# Patient Record
Sex: Female | Born: 1935 | Race: White | Hispanic: No | Marital: Married | Smoking: Former smoker
Health system: Southern US, Community
[De-identification: ages and names within clinical notes are randomized; demographics above are authoritative.]

## PROBLEM LIST (undated history)

## (undated) DIAGNOSIS — G4733 Obstructive sleep apnea (adult) (pediatric): Secondary | ICD-10-CM

## (undated) DIAGNOSIS — Q628 Other congenital malformations of ureter: Secondary | ICD-10-CM

## (undated) DIAGNOSIS — T8859XA Other complications of anesthesia, initial encounter: Secondary | ICD-10-CM

## (undated) DIAGNOSIS — K219 Gastro-esophageal reflux disease without esophagitis: Secondary | ICD-10-CM

## (undated) DIAGNOSIS — N1330 Unspecified hydronephrosis: Secondary | ICD-10-CM

## (undated) DIAGNOSIS — F32A Depression, unspecified: Secondary | ICD-10-CM

## (undated) DIAGNOSIS — M1990 Unspecified osteoarthritis, unspecified site: Secondary | ICD-10-CM

## (undated) DIAGNOSIS — N289 Disorder of kidney and ureter, unspecified: Secondary | ICD-10-CM

## (undated) DIAGNOSIS — R0602 Shortness of breath: Secondary | ICD-10-CM

## (undated) DIAGNOSIS — G2581 Restless legs syndrome: Secondary | ICD-10-CM

## (undated) DIAGNOSIS — I10 Essential (primary) hypertension: Secondary | ICD-10-CM

## (undated) DIAGNOSIS — F329 Major depressive disorder, single episode, unspecified: Secondary | ICD-10-CM

## (undated) DIAGNOSIS — F419 Anxiety disorder, unspecified: Secondary | ICD-10-CM

## (undated) DIAGNOSIS — T4145XA Adverse effect of unspecified anesthetic, initial encounter: Secondary | ICD-10-CM

## (undated) DIAGNOSIS — R351 Nocturia: Secondary | ICD-10-CM

## (undated) DIAGNOSIS — M75101 Unspecified rotator cuff tear or rupture of right shoulder, not specified as traumatic: Secondary | ICD-10-CM

## (undated) DIAGNOSIS — Z9989 Dependence on other enabling machines and devices: Secondary | ICD-10-CM

## (undated) DIAGNOSIS — E039 Hypothyroidism, unspecified: Secondary | ICD-10-CM

## (undated) HISTORY — PX: CARDIAC CATHETERIZATION: SHX172

## (undated) HISTORY — PX: OTHER SURGICAL HISTORY: SHX169

## (undated) HISTORY — PX: TONSILLECTOMY AND ADENOIDECTOMY: SUR1326

---

## 1962-09-15 HISTORY — PX: TUBAL LIGATION: SHX77

## 1976-09-15 HISTORY — PX: VAGINAL HYSTERECTOMY: SUR661

## 2000-09-15 HISTORY — PX: ABDOMINOPLASTY: SUR9

## 2001-11-19 ENCOUNTER — Ambulatory Visit: Admission: RE | Admit: 2001-11-19 | Discharge: 2002-02-17 | Payer: Self-pay | Admitting: Radiation Oncology

## 2001-12-14 ENCOUNTER — Ambulatory Visit (HOSPITAL_COMMUNITY): Admission: RE | Admit: 2001-12-14 | Discharge: 2001-12-14 | Payer: Self-pay | Admitting: *Deleted

## 2001-12-14 ENCOUNTER — Encounter (INDEPENDENT_AMBULATORY_CARE_PROVIDER_SITE_OTHER): Payer: Self-pay

## 2002-01-28 ENCOUNTER — Encounter: Admission: RE | Admit: 2002-01-28 | Discharge: 2002-01-28 | Payer: Self-pay | Admitting: Internal Medicine

## 2002-01-28 ENCOUNTER — Encounter: Payer: Self-pay | Admitting: Internal Medicine

## 2002-04-19 ENCOUNTER — Encounter: Admission: RE | Admit: 2002-04-19 | Discharge: 2002-04-19 | Payer: Self-pay | Admitting: Internal Medicine

## 2002-04-19 ENCOUNTER — Encounter: Payer: Self-pay | Admitting: Internal Medicine

## 2002-05-04 ENCOUNTER — Encounter: Payer: Self-pay | Admitting: Internal Medicine

## 2002-05-04 ENCOUNTER — Encounter: Admission: RE | Admit: 2002-05-04 | Discharge: 2002-05-04 | Payer: Self-pay | Admitting: Internal Medicine

## 2004-03-01 ENCOUNTER — Ambulatory Visit (HOSPITAL_COMMUNITY): Admission: RE | Admit: 2004-03-01 | Discharge: 2004-03-01 | Payer: Self-pay | Admitting: Gastroenterology

## 2005-09-24 HISTORY — PX: CARDIOVASCULAR STRESS TEST: SHX262

## 2006-08-19 ENCOUNTER — Encounter: Admission: RE | Admit: 2006-08-19 | Discharge: 2006-08-19 | Payer: Self-pay | Admitting: Internal Medicine

## 2007-12-29 ENCOUNTER — Inpatient Hospital Stay (HOSPITAL_COMMUNITY): Admission: EM | Admit: 2007-12-29 | Discharge: 2007-12-31 | Payer: Self-pay | Admitting: Family Medicine

## 2007-12-29 ENCOUNTER — Ambulatory Visit: Payer: Self-pay | Admitting: *Deleted

## 2007-12-29 ENCOUNTER — Ambulatory Visit: Payer: Self-pay | Admitting: Pulmonary Disease

## 2007-12-31 ENCOUNTER — Encounter (INDEPENDENT_AMBULATORY_CARE_PROVIDER_SITE_OTHER): Payer: Self-pay | Admitting: Interventional Cardiology

## 2007-12-31 HISTORY — PX: TRANSTHORACIC ECHOCARDIOGRAM: SHX275

## 2008-01-05 HISTORY — PX: OTHER SURGICAL HISTORY: SHX169

## 2008-01-13 ENCOUNTER — Encounter: Admission: RE | Admit: 2008-01-13 | Discharge: 2008-01-13 | Payer: Self-pay | Admitting: Internal Medicine

## 2008-02-04 ENCOUNTER — Ambulatory Visit (HOSPITAL_BASED_OUTPATIENT_CLINIC_OR_DEPARTMENT_OTHER): Admission: RE | Admit: 2008-02-04 | Discharge: 2008-02-05 | Payer: Self-pay | Admitting: Orthopedic Surgery

## 2008-08-15 ENCOUNTER — Ambulatory Visit (HOSPITAL_BASED_OUTPATIENT_CLINIC_OR_DEPARTMENT_OTHER): Admission: RE | Admit: 2008-08-15 | Discharge: 2008-08-15 | Payer: Self-pay | Admitting: Orthopedic Surgery

## 2008-08-15 ENCOUNTER — Encounter (INDEPENDENT_AMBULATORY_CARE_PROVIDER_SITE_OTHER): Payer: Self-pay | Admitting: Orthopedic Surgery

## 2008-08-15 HISTORY — PX: OTHER SURGICAL HISTORY: SHX169

## 2008-08-18 ENCOUNTER — Encounter: Admission: RE | Admit: 2008-08-18 | Discharge: 2008-08-18 | Payer: Self-pay | Admitting: Internal Medicine

## 2009-08-07 ENCOUNTER — Encounter: Admission: RE | Admit: 2009-08-07 | Discharge: 2009-08-07 | Payer: Self-pay | Admitting: Internal Medicine

## 2009-08-16 ENCOUNTER — Encounter: Admission: RE | Admit: 2009-08-16 | Discharge: 2009-08-16 | Payer: Self-pay | Admitting: Internal Medicine

## 2009-08-29 ENCOUNTER — Ambulatory Visit (HOSPITAL_COMMUNITY): Admission: RE | Admit: 2009-08-29 | Discharge: 2009-08-29 | Payer: Self-pay | Admitting: Urology

## 2011-01-28 NOTE — Assessment & Plan Note (Signed)
[facility]   NAME:  [name], [name]                 ACCOUNT NO.:  [id number]   MEDICAL RECORD NO.:  [id number]      DATE OF BIRTH:  03-31-1936   PHYSICIAN:  Lyn Records, M.D.         VISIT DATE:                                   OFFICE VISIT   CHIEF COMPLAINT:  Chest pain.   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  She states that around 7  p.m. she had the sudden onset of 8/10 left-sided chest pressure,  radiated down her left arm, that was associated with shortness of  breath.  She denies any palpations, focal weakness, nausea, vomiting, or  diaphoresis.  She stepped outside to get some fresh air with some mild  relief of her symptoms; however, they finally, spontaneously resolved  after about an hour.  She went to bed last night without any discomfort,  however, woke up this morning and went about her usual business and  later on in the afternoon developed recurrence of this chest pain, but  with no radiation, no associated symptoms.  She describes the pressure  as 4/10, and continued even she came to the emergency room for  evaluation.  In the emergency department, she was given some  nitroglycerin and placed on a nitroglycerin drip, with some mild relief  of her symptoms; however, she still complains of some mild chest  pressure currently.  She currently denies any other symptoms, and her  first set of cardiac markers showed a slightly positive D-dimer, as well  as slightly elevated troponin without any acute changes.   REVIEW OF SYSTEMS:  The patient complains of some mild lower extremity  edema, that is chronic.  She occasionally has palpitations related to  panic and anxiety attacks.  She denies any orthopnea or PND.  Remaining  review of systems as in the HPI, otherwise negative.   ALLERGIES:  1.  PENICILLIN.  2. SULFA.   PAST MEDICAL HISTORY:  1. Anxiety/panic attacks.  2. Hypertension.  3. Hypothyroidism.  4. History of a cardiac catheterization.  She was negative about 10-15      years ago, done by Dr. Katrinka Blazing.  5. History of a negative stress test, about 3 years ago.   CURRENT MEDICATIONS:  1. Alprazolam 0.5 mg p.r.n.  2. Aspirin 81 mg daily.  3. Benicar 40 mg daily.  4. Lexapro 10 mg daily.  5. Niaspan 500 mg daily.  6. Synthroid 112 mcg daily.   SOCIAL HISTORY:  She lives in [place] with her husband.  She quit  smoking 10 years ago.  She denies any other habits.   FAMILY HISTORY:  Her mother died of complications of a stroke and had  dementia.  Her father died of complications with lung cancer.  None of  her siblings have coronary disease, although she has a brother who has  had a carotid endarterectomy.   PHYSICAL EXAMINATION:  VITAL SIGNS:  Blood pressure is 131/54, pulse of  82, temperature of 97.5, respiratory rate of 20, and O2 saturation is  92% on  room air.  GENERAL:  She is alert and oriented x3 in no acute distress.  HEENT:  Normocephalic and atraumatic.  Pupils equal, round, and reactive  to light.  Sclerae anicteric.  NECK:  Supple with no lymphadenopathy.  No carotid bruits.  2+ carotid  pulses bilaterally.  LUNGS:  Clear to auscultation bilaterally without wheezes, rhonchi, or  rales.  CARDIOVASCULAR:  Normal rate and rhythm.  Normal S1 and S2 without any  murmurs, rubs, or gallops, as well as a nondisplaced PMI.  ABDOMEN:  Trunk obesity.  Positive bowel sounds.  Soft, nontender, and  nondistended.  EXTREMITIES:  2+ pulses with some trace bilateral pedal edema, but no  clubbing, no cyanosis  NEUROLOGIC:  Grossly nonfocal.   LABORATORY DATA:  Chest x-ray shows no effusion or edema.  No acute  process.  An EKG shows a normal sinus rhythm with rate of 62 with a  normal access, normal intervals without any Q waves or ST-T wave  changes,  suggestive of ischemia.  There is no prior for comparison.  CBC  is within normal limits.  The chem-7 is within normal limits.  Troponin  of 0.08.  BNP is less than 30.  CK-MB is 1.5.  A D-dimer is slightly  elevated at 52.   ASSESSMENT:  1. Atypical chest pain, but with an elevated troponin I, but with no      EKG changes.  Possibilities include a non-ST elevation myocardial      infarction versus an atypical presentation for pulmonary embolism.  2. Hypertension.  3. Anxiety disorder.  4. Hypothyroidism.   PLAN:  The patient will be admitted to telemetry bed, rule out by  cardiac enzymes; however, given that her first set of markers are  slightly positive, we will go ahead and place her on a full-dose Lovenox  q.12 h.  We will continue her on aspirin, ACE inhibitors, and start her  on the low-dose beta-blocker.  We will check lipids as well as a TSH.  Dr. Katrinka Blazing to see the patient in the morning and evaluate the patient  further for possible cardiac catheterization.  At this time, I recommend  a CT angiogram of the chest if cardiac catheterization in unrevealing.      Bimal R. Sherryll Burger, MD   Electronically Signed     ______________________________  Lyn Records, M.D.    BRS/MEDQ  D:  12/29/2007  T:  12/30/2007  Job:  [id number]

## 2011-01-28 NOTE — Op Note (Signed)
NAMEANNALIZA, [name]                 ACCOUNT NO.:  [id number]   MEDICAL RECORD NO.:  [id number]          PATIENT TYPE:  AMB   LOCATION:  DSC                          FACILITY:  [facility]   PHYSICIAN:  Cindee Salt, M.D.       DATE OF BIRTH:  1936/01/28   DATE OF PROCEDURE:  08/15/2008  DATE OF DISCHARGE:                               OPERATIVE REPORT   PREOPERATIVE DIAGNOSIS:  Status post open reduction and internal  fixation, left distal radius, distal volar radiation hand innovation  plate with mass extensor tendon, index finger, right hand.   POSTOPERATIVE DIAGNOSIS:  Status post open reduction and internal  fixation, left distal radius, distal volar radiation hand innovation  plate with mass extensor tendon, index finger, right hand.   OPERATIONS:  1. Removal of distal volar radiation plate.  2. Debridement of mass, index finger, extensor tendon, right hand.   SURGEON:  Cindee Salt, MD   ANESTHESIA:  General.   ANESTHESIOLOGIST:  Burna Forts, MD   HISTORY:  The patient is a 75 year old female who suffered a fall,  distal radius fracture, underwent open reduction and internal fixation  with a volar DVR hand innovations plate.  Approximately 3 months  following the surgery, she showed inability to extend her index finger  with weakness and a mass formed on the dorsal aspect of her hand over  the metacarpals with a possibility of a ruptured extensor tendon.  She  was advised removal of the plate with tendon transfer repair.  She  subsequently has had the ability to fully extend and flex her index  finger without difficulty, but the mass is palpable.  She is admitted  now for removal of the plate, exploration and extensor tendons.  She is  aware of risks and complications including infection, recurrence of  injury to arteries, nerves, tendons, incomplete relief of symptoms,  dystrophy, weakness of the bone, and possibility of refracture.  She is  desirous of proceeding to have  this done.  In the preoperative area, the  patient was seen and the extremity marked by both the patient and  surgeon.  Antibiotic given.   PROCEDURE:  The patient was brought to the operating room where general  anesthetic was carried out without difficulty under the direction of Dr.  Jacklynn Bue.  She was prepped using DuraPrep, supine position, left arm  free.  The limb was exsanguinated after a time-out was taken with an  Esmarch bandage and tourniquet inflated to 250 mmHg.  The dorsal  incision was made, carried down through subcutaneous tissue and mass,  which appeared to be a partial rupture of the extensor tendon.  The  index finger was identified.  The incision was extended proximally, the  retinaculum opened over the distal radius.  No penetration of screws  were noted.  The finger was able to be extended with both the extensor  indicis proprius and the extensor digitorum communis of the index  finger.  Mass at the index finger was then debrided and sent to  pathology.  The wound was irrigated.  There was  no impingement on the  extensor retinaculum with flexion/extension of the finger.  The skin  closed with interrupted 5-0 Vicryl Rapide sutures.  The old incision was  made on the volar aspect, carried down through subcutaneous tissue.  The  radial artery protected.  Dissection carried between it and the flexor  carpi radialis.  The pronator quadratus was noted to have fully healed.  This was incised on its radial aspect, the plate was reidentified and  removed without difficulty.  Full healing of the fracture was noted.  The wound was irrigated.  Pronator quadratus was repaired with figure-of-  eight and 4-0 Vicryl sutures.  The subcutaneous tissue with 4-0 Vicryl  and skin with interrupted 4-0 Vicryl Rapide.  A sterile compressive  dressing and splint with wrist dorsiflexed was applied.  The patient  tolerated the procedure well.  On deflation of the tourniquet, all  fingers  immediately pinked.  She was taken to the recovery room for  observation in satisfactory condition.  She will be discharged home.  To  return to the [facility] in 1 week, on Vicodin.           ______________________________  Cindee Salt, M.D.     GK/MEDQ  D:  08/15/2008  T:  08/16/2008  Job:  [id number]

## 2011-01-28 NOTE — Cardiovascular Report (Signed)
[name], [name] NO.:  [id number]   MEDICAL RECORD NO.:  [id number]          PATIENT TYPE:  INP   LOCATION:  3736                         FACILITY:  [facility]   PHYSICIAN:  Lyn Records, M.D.   DATE OF BIRTH:  1936/03/04   DATE OF PROCEDURE:  12/30/2007  DATE OF DISCHARGE:                            CARDIAC CATHETERIZATION   INDICATIONS:  Recurring chest discomfort over the last 48 hours with an  associated minimally elevated troponin I on presentation.  Normal EKG.  Risk factors for coronary artery disease.   PROCEDURE PERFORMED:  1. Left heart catheterization.  2. Selective coronary angiography.  3. Left ventriculography.   DESCRIPTION:  After informed consent, 6-French sheath was placed in the  right femoral artery using the SMART needle because of faintly palpable  pulses.  A single anterior stick was performed without complications.  Guidewire was advanced into the aorta and the sheath was placed using  the modified Seldinger technique.   A 6-French A2 multipurpose catheter was then inserted over the guidewire  and used for hemodynamic recordings, left ventriculography by hand  injection, and selective right coronary angiography.  We used a 6-French  #4 left Judkins catheter for left coronary angiography.  The patient  does not demonstrate any significant coronary disease.  We performed a  sheath injection but were not in proper position to perform closure with  Angio-Seal.  Manual compression was performed with good hemostasis.   RESULTS:  1. Hemodynamic data:      a.     Aortic pressure of 112/64.      b.     Left ventricular pressure of 115/50.  2. Left ventriculography:  Left ventricular cavity is normal in size,      and there is vigorous contractility with EF of 65%.  No mitral      regurgitation.  3. Coronary angiography:      a.     Left main coronary vessel is normal.  B:  Left anterior descending coronary artery:  Large vessel that  wraps  around the left ventricular apex.  The vessel is entirely normal.  C.  Ramus branch:  The ramus is a diffuse branch that bifurcates on the  left lateral wall and it is entirely normal.  D.  Right coronary:  The right coronary is a dominant vessel, large and  widely patent.   CONCLUSION:  1. Normal coronary arteries.  2. Normal LV function.  3. Chest discomfort.  4. Etiology uncertain, not felt secondary to ischemic heart disease or      significant coronary obstruction.   PLAN:  1. Bedrest x4 hours.  2. Ambulate.  3. Possible discharge later today.      Lyn Records, M.D.  Electronically Signed     HWS/MEDQ  D:  12/30/2007  T:  12/31/2007  Job:  [id number]   cc:   [facility]

## 2011-01-28 NOTE — Consult Note (Signed)
[name], [name]                 ACCOUNT NO.:  [id number]   MEDICAL RECORD NO.:  [id number]          PATIENT TYPE:  INP   LOCATION:  3736                         FACILITY:  [facility]   PHYSICIAN:  Oretha Milch, MD      DATE OF BIRTH:  1936-02-28   DATE OF CONSULTATION:  12/30/2007  DATE OF DISCHARGE:                                 CONSULTATION   PRIMARY CARE PHYSICIAN:  Dr. Jethro Bolus and Dr. Fayrene Fearing.   REASON FOR CONSULTATION:  Unexplained dyspnea.   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  She felt a little bit nauseous,  then went home and laid down and this seemed to resolve.  However, while  playing bridge the next day with a retired Engineer, civil (consulting), she was asked to seek  evaluation for this.  She had an initial positive troponin and underwent  a diagnostic cardiac catheterization, which showed normal coronaries and  normal LV function.   She describes dyspnea for the past few months.  This seems to happen  when she walks with her dog.  She occasionally gets on a treadmill, [place] and [place] then.  She describes a 20-30  pounds weight loss over the past few years.  Her husband was not present  here to collaborate the history, apparently reported that she breathes  heavily in her sleep and has noted a few pauses in her sleep.  He also  has reported snoring.   PAST MEDICAL HISTORY:  Includes hypertension, hypothyroidism, status  post radioiodine therapy, anxiety, and panic attacks.   PAST SURGICAL HISTORY:  Hysterectomy.   ALLERGIES:  PENICILLIN and SULFA.   CURRENT MEDICATIONS PRIOR TO ADMISSION:  Include:  1. Alprazolam 0.5 mg p.r.n.  2. Ambien 10 mg nightly p.r.n. (once or twice a week).  3. Aspirin 81 mg daily.  4. Benicar 40 mg  daily.  5. Lexapro 10 mg daily.  6. Niaspan 500 mg daily.  7. Synthroid 112 mcg daily.   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  She  quit smoking 10 years ago, a pack would last her a week.  Started  smoking since teenage years.  She lives in [place] with her husband.   FAMILY HISTORY:  Mother died of CVA and dementia.  Father died of lung  cancer.   REVIEW OF SYSTEMS:  Reports dyspnea on exertion.  Denies nausea,  vomiting, and abdominal symptoms.  Reports 30-40 pound weight gain over  the past several years.  By her sleep history, sleep onset is around 11  p.m.  She takes Ambien once or twice a week due to delayed sleep  latency, which may be about sometimes 30-60 minutes.  She wakes up 2-3  times including bathroom visits.  Moderate snoring has been described by  her husband.  She wakes up at  around 8 a.m. feeling refreshed.  She naps  for about 10-15 minutes at the afternoon.  She denies daytime  somnolence.   PHYSICAL EXAMINATION:  GENERAL:  A  woman sitting up in bed in no  apparent respiratory distress.  VITAL SIGNS:  Heart rate 78 per minute, respirations 16 per minute,  oxygen saturation 98% on room air, and blood pressure 138/74.  HEENT:  Pupils are equal. .  NECK:  Supple.  No JVD.  No lymphadenopathy.  CV:  S1 and S2 normal.  CHEST:  Clear to auscultation.  ABDOMEN:  Soft and nontender.  EXTREMITIES:  No edema.  No calf tenderness.   LABORATORY DATA:  1. WBC count 7.0, hemoglobin 13.9, and platelets 180.  Troponin was      less than 0.01 and D-dimer was 1.52.  BNP level was less than 30.      Admission troponin was 0.08.  BUN and creatinine __________ .  2. Unexplained dyspnea on exertion, rule out mild COPD, versus      deconditioning due to weight gain.  3. Based on snoring and witnessed pauses, she likely has mild-to-      moderate obstructive sleep apnea, although she denies daytime      hypersomnolence.   RECOMMENDATIONS:  1. I  think the clinical probability of pulmonary embolism here is      extremely low.  Given that she only has one kidney, I will not      subject her to a contrast load.  If her chest pain is persistent,      however, we may consider doing a V/Q scan in the future.  2. A spirometry will be obtained to assess lung function given her      long history of smoking.  I would be surprised if I find more than      mild airway obstruction, however.  3. The pathophysiology of obstructive sleep apnea has cardiovascular      consequences and modes of treatment including CPAP were discussed      with the patient in great detail, and she evidenced understanding.      She has a brother who is on a CPAP and is familiar with the      therapy.  At this point, she does not wish to pursue this further;      however, we will obtain an office followup for her to discuss this      some more in the future.   Weight loss was encouraged.   Thank you Dr. Katrinka Blazing for involving [place] in the care of this patient.  We  will assist with her management during her hospital stay and after.      Oretha Milch, MD  Electronically Signed     RVA/MEDQ  D:  12/30/2007  T:  12/31/2007  Job:  [id number]

## 2011-01-28 NOTE — Discharge Summary (Signed)
[name], [name]                 ACCOUNT NO.:  [id number]   MEDICAL RECORD NO.:  [id number]          PATIENT TYPE:  INP   LOCATION:  3736                         FACILITY:  [facility]   PHYSICIAN:  Guy Franco, P.A.       DATE OF BIRTH:  03/14/1936   DATE OF ADMISSION:  12/29/2007  DATE OF DISCHARGE:  12/31/2007                               DISCHARGE SUMMARY   DISCHARGE DIAGNOSES:  1. Chest pain, noncardiac in nature.  2. Shortness of breath  3. Hypertension.  4. Hypothyroidism.  5. Anxiety.   HOSPITAL COURSE:  [name] is a 75 year old female who was admitted to  [facility] with chest pain radiating into her left arm  accompanied with shortness of breath.  Ultimately, she had a initial  elevated to troponin with subsequent values being normal.  Her EKG  remained normal.  She continued to have worsen of her chest discomfort,  therefore we performed a cardiac catheterization, which is  angiographically normal.   She does have some shortness of breath and we did obtain a consultant  with Dr. Vassie Loll, who felt that the patient's shortness of breath to be  secondary to either conditioning versus COPD.  The patient was an ex-  smoker, therefore PFTs was obtained that showed minimal obstructive  airway disease.  One of the thought that Dr. Vassie Loll had regarding this  patient was that it could be a component of mild obstructive sleep apnea  that could be worked up as an outpatient if need be.   The 2-D echo has been performed.  It has not yet been read.  Dr. Katrinka Blazing  is reading that sometime today.   We will instruct the patient to go home and follow up with Dr. Kevan Ny in  2 weeks.   Clean cath site gently with soap and water.  No scrubbing.  Remain on a  low sodium, heart-healthy diet.   Continue same home medications which included:  1. Lexapro 10 mg a day.  2. Synthroid 112 mcg a day.  3. Niacin 500 mg a day.  4. Baby aspirin 81 mg a day.  5. Benicar 40 mg a day.      Guy Franco, P.A.    LB/MEDQ  D:  12/31/2007  T:  01/01/2008  Job:  [id number]   cc:   Candyce Churn, M.D.

## 2011-01-28 NOTE — Op Note (Signed)
NAMEJAZIAH, [name]                 ACCOUNT NO.:  [id number]   MEDICAL RECORD NO.:  [id number]          PATIENT TYPE:  AMB   LOCATION:  DSC                          FACILITY:  [facility]   PHYSICIAN:  Cindee Salt, M.D.       DATE OF BIRTH:  1936/01/01   DATE OF PROCEDURE:  DATE OF DISCHARGE:                               OPERATIVE REPORT   PREOPERATIVE DIAGNOSIS:  Bilateral distal radius fractures, intra-  articular.   POSTOPERATIVE DIAGNOSIS:  Bilateral distal radius fractures, intra-  articular.   OPERATION:  Open reduction and internal fixation, bilateral distal  radius fractures with [employer] DVR plates.   SURGEON:  Orie Rout, MD   ASSISTANT:  Artist Pais. Mina Marble, MD   ANESTHESIA:  General.   DATE OF OPERATION:  Feb 04, 2008.   ANESTHESIOLOGIST:  Zenon Mayo, MD   HISTORY:  The patient is a 75 year old female who was attempting to  retrieve her dog in the backyard when she tripped over a root in the  yard suffering a fall resulting in bilateral distal radius fractures.  She has significant displacement of the left, minimal displacement of  the right, both intra-articular in nature.  She is admitted for open  reduction and internal fixation of bilateral distal radius fractures for  an improved function and the necessity of only splints rather than a  full cast.  She is aware of risks and complications including infection,  recurrence, injury to arteries, nerves, tendons, complete relief of  symptoms, dystrophy, nonunion, and loss of fixation.  In the  preoperative area, the patient is seen, questions encouraged and  answered, the extremity marked by both the patient and surgeon, and  antibiotic given.   PROCEDURE:  The patient was brought to the operating room, where a  general anesthetic was carried out without difficulty.  She was prepped  using DuraPrep, supine position, left arm free.  A time-out was then  held.  The limb was exsanguinated after  closed manipulation and image  intensification revealing reduction of the fracture fragments.  A  tourniquet placed high on the arm was inflated to 150 mmHg.  The volar  radial incision was made, carried to the radial aspect of the styloid  and then back ulnarly, carried down through subcutaneous tissue.  Bleeders were electrocauterized.  Dissection carried down through the  flexor carpi radialis tendon sheath protecting the radial artery.  The  pronator quadratus was then elevated off from the distal radius.  The  watershed area was noted.  The fracture fragments were found to be  reduced.  A narrow DVR standard left plate was then selected and this  was placed and pinned in position.  X-rays confirmed positioning in both  AP and lateral direction.  The gliding screw was then placed, this  measured 10 mm.  This was fixed.  The plate was fixed in place and the  distal screws were placed.  The initial ulnar screw was placed as a  nonlocking threaded screw drawing the plate up to the fracture fragment.  This measured 18 mm.  This was placed.  X-rays confirmed that it was not  within the joint.  Remaining screws and pegs were then placed.  These  measured between 18 and 20 mm.  Screws were placed to the radial and one  ulnar screw.  The remainder were smooth pegs with locking components.  X-  rays confirmed that no plates were in the joint.  This included AP,  lateral, and two obliques looking down the slope of the radius and one  with the radius in AP profile to inspect the articular surface, and no  screw was found to penetrate.  The remaining two proximal screws were  placed, both measuring 10 mm.  The wound was irrigated.  The pronator  quadratus was then repaired with figure-of-eight 4-0 Vicryl sutures.  The subcutaneous tissue with 4-0 Vicryl and the skin with interrupted 4-  0 Vicryl Rapide sutures.  A sterile compressive dressing was applied.  Tourniquet deflated.  The fingers  immediately pinked.  The right wrist  was then prepped and draped.  A sterile forearm tourniquet placed on the  right side.  Manipulation x-ray intensification revealed that the  fracture was in good position.  The similar volar incision was made and  carried down through the subcutaneous tissue.  Bleeders again  electrocauterized with bipolar.  The superficial branch of the radial  artery was identified and protected.  Dissection carried down to the  pronator quadratus.  This was elevated.  The watershed area identified,  a right narrow standard DVR plate was then placed, and pinned in  position.  This was pinned several times to get an adequate position.  The gliding screw was then placed again measuring 10 mm.  The distal  screws were then placed similar to the left side measuring 18-20 mm.  These were nonlocking screws on the ulnar aspect without penetration of  the dorsal cortex.  This was done on both sides.  This pulled the plate  up against the bone.  The remaining screws were then placed.  X-rays in  the AP, lateral, and oblique directions revealed that no screws  penetrated into the joint surface.  Remaining two proximal screws were  placed.  These again each measured 10 mm.  This firmly fixed the  fracture in position.  Full pronation and supination was afforded to  each side with full flexion and extension on each side.  A sterile  compressive dressing was applied followed by splint to each side.  On  deflation of the tourniquet, all fingers immediately pinked.  She was  taken to the recovery room for observation in satisfactory condition.  She will be admitted for overnight stay for pain control.  She will be  discharged on Percocet to return to the office in 1 week.           ______________________________  Cindee Salt, M.D.     GK/MEDQ  D:  02/04/2008  T:  02/05/2008  Job:  [id number]   cc:   Candyce Churn, M.D.

## 2011-06-10 LAB — COMPREHENSIVE METABOLIC PANEL
ALT: 27
AST: 30
Albumin: 3.6
Calcium: 8.7
Sodium: 132 — ABNORMAL LOW
Total Protein: 6.3

## 2011-06-10 LAB — POCT CARDIAC MARKERS
CKMB, poc: 1.5
Operator id: 282201
Troponin i, poc: <0.05

## 2011-06-10 LAB — CK TOTAL AND CKMB (NOT AT ARMC)
Total CK: 35
Total CK: 72

## 2011-06-10 LAB — PROTIME-INR: INR: 1

## 2011-06-10 LAB — CBC
Hemoglobin: 12.5
MCHC: 33.9
MCHC: 35.7
MCV: 94.4
MCV: 94.5
MCV: 94.7
Platelets: 180
Platelets: 214
RBC: 3.72 — ABNORMAL LOW
RBC: 4.31
RBC: 4.78
WBC: 8.9

## 2011-06-10 LAB — APTT

## 2011-06-10 LAB — POCT I-STAT, CHEM 8
BUN: 26 — ABNORMAL HIGH
Calcium, Ion: 1.07 — ABNORMAL LOW
Chloride: 98
Glucose, Bld: 82
TCO2: 24

## 2011-06-10 LAB — BASIC METABOLIC PANEL
CO2: 27
Calcium: 8.4
Glucose, Bld: 104 — ABNORMAL HIGH
Sodium: 133 — ABNORMAL LOW

## 2011-06-10 LAB — DIFFERENTIAL
Basophils Relative: 0
Eosinophils Absolute: 0
Lymphs Abs: 2.9
Neutro Abs: 5.2
Neutrophils Relative %: 58

## 2011-06-10 LAB — CARDIAC PANEL(CRET KIN+CKTOT+MB+TROPI)
CK, MB: 1.7
Total CK: 45

## 2011-06-10 LAB — D-DIMER, QUANTITATIVE

## 2011-06-10 LAB — TROPONIN I

## 2011-06-10 LAB — B-NATRIURETIC PEPTIDE (CONVERTED LAB): Pro B Natriuretic peptide (BNP): <30

## 2011-06-11 LAB — POCT I-STAT, CHEM 8
Calcium, Ion: 1.11 — ABNORMAL LOW
Chloride: 98
Glucose, Bld: 102 — ABNORMAL HIGH
HCT: 42
Hemoglobin: 14.3
TCO2: 26

## 2011-06-20 LAB — POCT I-STAT, CHEM 8
BUN: 21 mg/dL (ref 6–23)
Calcium, Ion: 1.24 mmol/L (ref 1.12–1.32)
Hemoglobin: 13.9 g/dL (ref 12.0–15.0)
Sodium: 140 mEq/L (ref 135–145)
TCO2: 26 mmol/L (ref 0–100)

## 2011-07-03 ENCOUNTER — Other Ambulatory Visit: Payer: Self-pay | Admitting: Orthopedic Surgery

## 2011-07-03 ENCOUNTER — Encounter (HOSPITAL_COMMUNITY): Payer: Medicare Other

## 2011-07-03 ENCOUNTER — Ambulatory Visit (HOSPITAL_COMMUNITY)
Admission: RE | Admit: 2011-07-03 | Discharge: 2011-07-03 | Disposition: A | Discharge status: Home / Self Care | Payer: Medicare Other | Source: Ambulatory Visit | Attending: Orthopedic Surgery | Admitting: Orthopedic Surgery

## 2011-07-03 ENCOUNTER — Other Ambulatory Visit (HOSPITAL_COMMUNITY): Payer: Self-pay | Admitting: Orthopedic Surgery

## 2011-07-03 DIAGNOSIS — Z01811 Encounter for preprocedural respiratory examination: Secondary | ICD-10-CM | POA: Insufficient documentation

## 2011-07-03 DIAGNOSIS — Z01818 Encounter for other preprocedural examination: Secondary | ICD-10-CM | POA: Insufficient documentation

## 2011-07-03 DIAGNOSIS — M169 Osteoarthritis of hip, unspecified: Secondary | ICD-10-CM | POA: Insufficient documentation

## 2011-07-03 DIAGNOSIS — I10 Essential (primary) hypertension: Secondary | ICD-10-CM | POA: Insufficient documentation

## 2011-07-03 DIAGNOSIS — M1610 Unilateral primary osteoarthritis, unspecified hip: Secondary | ICD-10-CM | POA: Insufficient documentation

## 2011-07-03 DIAGNOSIS — Z01812 Encounter for preprocedural laboratory examination: Secondary | ICD-10-CM | POA: Insufficient documentation

## 2011-07-03 LAB — PROTIME-INR: Prothrombin Time: 13.9 seconds (ref 11.6–15.2)

## 2011-07-03 LAB — BASIC METABOLIC PANEL
BUN: 18 mg/dL (ref 6–23)
Chloride: 97 mEq/L (ref 96–112)
Creatinine, Ser: 0.93 mg/dL (ref 0.50–1.10)
Glucose, Bld: 106 mg/dL — ABNORMAL HIGH (ref 70–99)
Potassium: 4.4 mEq/L (ref 3.5–5.1)

## 2011-07-03 LAB — URINALYSIS, ROUTINE W REFLEX MICROSCOPIC
Bilirubin Urine: NEGATIVE
Hgb urine dipstick: NEGATIVE
Nitrite: NEGATIVE
Protein, ur: NEGATIVE mg/dL
Urobilinogen, UA: 1 mg/dL (ref 0.0–1.0)

## 2011-07-03 LAB — CBC
HCT: 45.2 % (ref 36.0–46.0)
Hemoglobin: 15.5 g/dL — ABNORMAL HIGH (ref 12.0–15.0)
MCH: 32.8 pg (ref 26.0–34.0)
MCHC: 34.3 g/dL (ref 30.0–36.0)
MCV: 95.8 fL (ref 78.0–100.0)

## 2011-07-03 LAB — DIFFERENTIAL
Basophils Absolute: 0 10*3/uL (ref 0.0–0.1)
Basophils Relative: 0 % (ref 0–1)
Eosinophils Absolute: 0.2 10*3/uL (ref 0.0–0.7)
Monocytes Relative: 11 % (ref 3–12)
Neutro Abs: 4.5 10*3/uL (ref 1.7–7.7)
Neutrophils Relative %: 65 % (ref 43–77)

## 2011-07-07 NOTE — H&P (Signed)
[name], [name]                 ACCOUNT NO.:  [id number]  MEDICAL RECORD NO.:  [id number]  LOCATION:                                 FACILITY:  PHYSICIAN:  Madlyn Frankel. Charlann Boxer, M.D.  DATE OF BIRTH:  1936-01-14  DATE OF ADMISSION:  07/08/2011 DATE OF DISCHARGE:                             HISTORY & PHYSICAL   DATE OF SURGERY:  July 08, 2011.  ADMITTING DIAGNOSIS:  Osteoarthritis, right hip.  HISTORY OF PRESENT ILLNESS:  This is a 75 year old lady with a history of osteoarthritis of her right hip with failure of conservative treatment to alleviate her symptoms.  After discussion of treatment, benefits, risks, and options, the patient is now scheduled for total hip arthroplasty of the right hip by anterior approach.  Note that the patient's medical doctor is Dr. Robley Fries.  She is a candidate for trans-Cinnamic acid will receive that in preop.  The patient does have sleep apnea and uses CPAP machine and will need that postoperatively.  PAST MEDICAL HISTORY:  Drug allergy to PENICILLIN with a rash and SULFA with shortness of breath.  CURRENT MEDICATIONS: 1. Synthroid 125 mcg daily. 2. Celebrex 200 mg daily. 3. Citalopram HBr 20 mg daily. 4. Hydrocodone p.r.n. 5. Benicar/hydrochlorothiazide 40/12.5 daily. 6. Metoprolol 50 mg daily. 7. Lasix 40 mg daily p.r.n. 8. Klor-Con 20 mg daily p.r.n. 9. Ropinirole two per day. 10.Zolpidem one tablet daily. 11.Pramipexole one daily. 12.Alprazolam p.r.n. 13.Vitamins and calcium.  MEDICAL ILLNESSES:  Include; 1. Hypothyroidism. 2. Hypertension. 3. Restless legs syndrome. 4. Anxiety. 5. Sleep apnea.  PREVIOUS SURGERIES:  Include tubal ligation, hysterectomy, and ORIF of wrist fracture.  FAMILY HISTORY:  Positive for lung cancer, diabetes, stroke, Alzheimer's.  SOCIAL HISTORY:  The patient is married.  She is retired.  She has 1-2 glasses of wine per day and does not smoke.  She is going home after surgery.  REVIEW OF  SYSTEMS:  CENTRAL NERVOUS SYSTEM:  Positive for depression and anxiety.  PULMONARY:  Positive for sleep apnea with CPAP. CARDIOVASCULAR:  Positive for hypertension, negative for chest pain or palpitation.  GI:  Positive for reflux, negative for ulcers, hepatitis. GU: Negative for urinary tract difficulty.  MUSCULOSKELETAL:  Positive in HPI.  PHYSICAL EXAM:  VITAL SIGNS:  BP 150/98, respirations 16, pulse 76 and regular. GENERAL APPEARANCE:  This is an obese lady, in no acute distress. HEENT:  Head normocephalic.  Nose patent.  Ears patent.  Pupils equal, round, reactive to light.  Throat without injection. NECK:  Supple without adenopathy.  Carotids 2+ without bruit. CHEST:  Clear to auscultation.  No rales or rhonchi.  Respirations 16. HEART:  Regular rate and rhythm at 76 beats per minute without murmur. ABDOMEN:  Soft with active bowel sounds.  No masses, organomegaly. NEUROLOGIC:  Patient alert and oriented to time, place and person. Cranial nerves II through XII grossly intact. EXTREMITIES:  Shows the right hip with decreased range of motion with pain. NEUROVASCULAR STATUS:  Intact.  IMPRESSION:  Osteoarthritis, right hip.  PLAN:  Total hip arthroplasty by anterior approach, right hip.     Jaquelyn Bitter. Chabon, P.A.   ______________________________ Madlyn Frankel Charlann Boxer, M.D.  SJC/MEDQ  D:  07/02/2011  T:  07/02/2011  Job:  [id number]  Electronically Signed by Jodene Nam P.A. on 07/03/2011 04:55:21 PM Electronically Signed by Durene Romans M.D. on 07/07/2011 12:40:43 PM

## 2011-07-08 ENCOUNTER — Inpatient Hospital Stay (HOSPITAL_COMMUNITY): Payer: Medicare Other

## 2011-07-08 ENCOUNTER — Inpatient Hospital Stay (HOSPITAL_COMMUNITY)
Admission: RE | Admit: 2011-07-08 | Discharge: 2011-07-11 | DRG: 470 | Disposition: A | Discharge status: Home Health | Payer: Medicare Other | Source: Ambulatory Visit | Attending: Orthopedic Surgery | Admitting: Orthopedic Surgery

## 2011-07-08 DIAGNOSIS — F411 Generalized anxiety disorder: Secondary | ICD-10-CM | POA: Diagnosis present

## 2011-07-08 DIAGNOSIS — M169 Osteoarthritis of hip, unspecified: Principal | ICD-10-CM | POA: Diagnosis present

## 2011-07-08 DIAGNOSIS — G4733 Obstructive sleep apnea (adult) (pediatric): Secondary | ICD-10-CM | POA: Diagnosis present

## 2011-07-08 DIAGNOSIS — G2581 Restless legs syndrome: Secondary | ICD-10-CM | POA: Diagnosis present

## 2011-07-08 DIAGNOSIS — I10 Essential (primary) hypertension: Secondary | ICD-10-CM | POA: Diagnosis present

## 2011-07-08 DIAGNOSIS — Z6836 Body mass index (BMI) 36.0-36.9, adult: Secondary | ICD-10-CM

## 2011-07-08 DIAGNOSIS — M1610 Unilateral primary osteoarthritis, unspecified hip: Principal | ICD-10-CM | POA: Diagnosis present

## 2011-07-08 DIAGNOSIS — Z01812 Encounter for preprocedural laboratory examination: Secondary | ICD-10-CM

## 2011-07-08 DIAGNOSIS — E039 Hypothyroidism, unspecified: Secondary | ICD-10-CM | POA: Diagnosis present

## 2011-07-08 DIAGNOSIS — R339 Retention of urine, unspecified: Secondary | ICD-10-CM | POA: Diagnosis not present

## 2011-07-08 DIAGNOSIS — F19921 Other psychoactive substance use, unspecified with intoxication with delirium: Secondary | ICD-10-CM | POA: Diagnosis not present

## 2011-07-08 HISTORY — PX: TOTAL HIP ARTHROPLASTY: SHX124

## 2011-07-08 LAB — ABO/RH: ABO/RH(D): O NEG

## 2011-07-08 LAB — TYPE AND SCREEN

## 2011-07-09 LAB — BASIC METABOLIC PANEL
CO2: 28 mEq/L (ref 19–32)
Chloride: 99 mEq/L (ref 96–112)
GFR calc non Af Amer: 60 mL/min — ABNORMAL LOW (ref >90)
Glucose, Bld: 117 mg/dL — ABNORMAL HIGH (ref 70–99)
Potassium: 4.8 mEq/L (ref 3.5–5.1)
Sodium: 132 mEq/L — ABNORMAL LOW (ref 135–145)

## 2011-07-09 LAB — CBC
Hemoglobin: 12.5 g/dL (ref 12.0–15.0)
MCHC: 34 g/dL (ref 30.0–36.0)
RBC: 3.78 MIL/uL — ABNORMAL LOW (ref 3.87–5.11)
WBC: 8.8 10*3/uL (ref 4.0–10.5)

## 2011-07-10 LAB — CBC
Hemoglobin: 12.3 g/dL (ref 12.0–15.0)
MCH: 33.2 pg (ref 26.0–34.0)
RBC: 3.7 MIL/uL — ABNORMAL LOW (ref 3.87–5.11)

## 2011-07-10 LAB — BASIC METABOLIC PANEL
CO2: 29 mEq/L (ref 19–32)
Calcium: 9.1 mg/dL (ref 8.4–10.5)
Glucose, Bld: 115 mg/dL — ABNORMAL HIGH (ref 70–99)
Sodium: 131 mEq/L — ABNORMAL LOW (ref 135–145)

## 2011-07-14 NOTE — Op Note (Signed)
[name], [name] NO.:  [id number]  MEDICAL RECORD NO.:  [id number]  LOCATION:  0006                         FACILITY:  [facility]  PHYSICIAN:  [name], M.D.  DATE OF BIRTH:  03-28-1936  DATE OF PROCEDURE:  07/08/2011 DATE OF DISCHARGE:                              OPERATIVE REPORT   PREOPERATIVE DIAGNOSIS:  Right hip osteoarthritis.  POSTOPERATIVE DIAGNOSIS:  Right hip osteoarthritis.  PROCEDURE:  Right total hip replacement through an anterior approach utilizing DePuy component size 50 pinnacle cup, a size 32+ 4 neutral Ultrex liner, a size 2 high Tri Lock stem with a 32+ 1.5 delta ceramic ball.  SURGEON:  [name], M.D.  ASSISTANT:  Sarah Gins, PA  [name] was present for the entirety of the case involved from preoperative positioning, perioperative retractor management, general facilitation of the case, as well as primary wound closure as assistant.  ANESTHESIA:  General.  SPECIMENS:  None.  COMPLICATIONS:  None.  BLOOD LOSS:  About 400 mL.  DRAINS:  One Hemovac.  INDICATION OF THE PROCEDURE:  [name] is a 75 year old female who had presented to office for evaluation of hip pain.  Radiographs revealed that she had progressive degenerative changes with bone-on-bone articulation to the right hip joint.  She had painful limited range of motion significantly affect her overall quality of life.  Beginning of failure to respond to conservative measures, she at this point was ready to proceed with more definitive measures.  She has noted progressive degenerative changes in his hip, progressive problems and dysfunction with regarding the hip prior to surgery.  Consent was obtained for benefit of pain relief.  Specific risk of infection, DVT, component failure, dislocation, need for revision surgery, as well discussion of the anterior versus posterior approach were reviewed.  Consent was obtained for benefit of anterior  pain relief through an anterior approach.  PROCEDURE IN DETAIL:  The patient was brought to operative theater. Once adequate anesthesia, preoperative antibiotics, Ancef administered. The patient was positioned supine on the OSI Hanna table.  Once adequate padding and bony process was carried out, we had predraped out the hip, used fluoroscopy to confirm orientation of the pelvis and position.  The right hip was then prepped and draped from proximal iliac crest to mid thigh with shower curtain technique.  Time-out was performed identifying the patient, planned procedure, and extremity.  An incision was then made 2 cm distal and lateral to this anterior superior iliac spine extending over the orientation of the tensor fascia lata muscle and sharp dissection was carried down to the fascia of the muscle and protractor placed in the soft tissues.  The fascia was then incised.  The muscle belly was identified and swept laterally and retractor placed on the superior neck.  Following cauterization of the circumflex vessels and removing some pericapsular fat, second retractor was placed on the inferior neck.  As third retractor was placed on the anterior acetabulum after elevating the anterior rectus.  A neck osteotomy was then made along the lines of the superior neck to the trochanteric fossa, then extended proximally and distally.  Stay sutures were placed and the retractors  were then placed intracapsular, identified then orientation of the trochanteric fossa and identify the orientation of my neck cut, confirmed this radiographically and then made a neck osteotomy with the femur on traction.  The femoral head was removed without difficulty or complication.  Traction was let off and retractors were placed posterior and anterior for the acetabulum.  The labrum and foveal tissue were debrided.  I began reaming with a 42 reamer and reamed up to 49 reamer with good bony bed preparation and  50 cup was chosen.  50 cup was then impacted under fluoroscopy and confirmed the depth of penetration and orientation and turned to abduction.  A screw was placed to the hole limiter placed in the final 32+ 4 liner position.  The cup was positioned anatomically within the acetabular portion of the pelvis.  At this point, the femur was rolled at 80 degrees.  Further capsule were released off the inferior aspect of the neck was carried out.  I then released the superior capsule proximally.  The hook was placed laterally along the femur and elevated manually and held in position with the bed hook.  The leg was then extended and adducted with the leg roll to 100 degrees of external rotation.  Once the proximal femur was fully exposed, I used a box osteotome and started my osteotomy.  I then began broaching with the starting broach and passed this by hand and then went from a 0-1 with a 1 broach in place, I did a trial reduction.  With the trial reduction, we felt that we would try to go up 1 more size also was using a high offset neck.  The offset was appropriate, leg lengths appeared to be within a few millimeters.  Given the current situation and her contralateral hip radiographs, I then felt this was appropriate. Given these findings, I went ahead and broached, repositioned all retractors and positioned the right hip and extended and abducted away, and I broached up to a size 2 broach, was set now at the level of my neck cut.  Given this and the findings. The 2 high Tri Lock stem was chosen and it was impacted down to the level of neck cut.  Based on this and the trial reduction, a 32+ 1.5 delta ceramic ball was chosen and impacted onto a clean and dry trunnion, and the hip was reduced.  The hip had been irrigated throughout the case again at this point.  I did reapproximate the superior capsular leaflet to the anterior leaflet using #1 Vicryl, placed a medium Hemovac drain deep.  The  fascia of the tensor fascia lata muscle was then reapproximated using #1 Vicryl.  The remaining wound was closed with 2-0 Vicryl and running 4-0 Monocryl. The hip was cleaned, dried, and dressed sterilely using Dermabond and Aquacel dressing.  Drain site dressed separately.  She was then brought to recovery room in stable condition tolerating the procedure well.     Sarah Frankel Charlann Diaz, M.D.    MDO/MEDQ  D:  07/08/2011  T:  07/08/2011  Job:  [id number]  Electronically Signed by Durene Romans M.D. on 07/14/2011 09:15:38 AM

## 2011-07-17 NOTE — Discharge Summary (Signed)
[name], LAPAGE NO.:  [id number]  MEDICAL RECORD NO.:  [id number]  LOCATION:  1620                         FACILITY:  [facility]  PHYSICIAN:  Madlyn Frankel. Charlann Boxer, M.D.  DATE OF BIRTH:  02-12-1936  DATE OF ADMISSION:  07/08/2011 DATE OF DISCHARGE:  07/11/2011                              DISCHARGE SUMMARY   PROCEDURE:  Right total hip arthroplasty, anterior approach.  ATTENDING PHYSICIAN:  Madlyn Frankel. Charlann Boxer, M.D.  ADMITTING DIAGNOSIS:  Right hip osteoarthritis.  DISCHARGE DIAGNOSES: 1. Status post total hip arthroplasty, anterior approach. 2. Hypothyroidism. 3. Hypertension. 4. Restless legs syndrome 5. Anxiety. 6. Sleep apnea.  HISTORY OF PRESENT ILLNESS:  The patient is a 75 year old lady with a history of osteoarthritis of the right hip.  The patient has tried several conservative treatments, all of which have failed to alleviate her symptoms.  X-rays in the clinic did show osteoarthritic changes of the right hip.  Various options were discussed with the patient.  The patient wished to proceed with surgery.  Risks, benefits, and expectations of procedure were discussed with the patient.  The patient understands risks, benefits, and expectations and wished to proceed with a right total hip arthroplasty, anterior approach per Dr. Charlann Boxer at [facility].  HOSPITAL COURSE:  The patient underwent the above-stated procedure on July 08, 2011.  The patient tolerated the procedure well, was brought to the recovery room in good condition, subsequently to the floor.  Postop day 1, July 09, 2011, the patient doing well.  No events. Pain is well controlled.  H and H is 12.5/36.8.  She is afebrile. Vital signs stable.  Distally neurovascular intact with good sensation to touch.  Epidurals in place.  Pain is minimal because of the epidural. The patient did well with physical therapy/OT.  Hemovac drain was removed.  Foley was removed and IV was changed to  saline lock.  Postop day 2, on July 10, 2011, the patient doing well, now slight confusion.  The pain does seem to be controlled with the medicine.  She is afebrile.  Blood pressure was 185/79, heart rate 84, respirations 18. She is distally neurovascularly intact.  Dressing is good, clean, dry, and intact.  She has H and H 12.3/36.0.  The patient did not do so well with physical therapy in the morning session, still a lot better in the afternoon session after the confusion from the medicine cleared some.  Postop day 3, July 11, 2011, the patient is doing well.  Confusion is significantly decreased since the pain medicine has been not given to her as scheduled as needed.  She is afebrile.  Vital signs stable.  The right hip is clean, dry, and intact.  The patient did well with physical therapy and was felt to be doing well enough to be discharged home.  DISCHARGE CONDITION:  Good.  DISCHARGE INSTRUCTIONS:  The patient will be discharged home with home health PT after having physical therapy in the hospital.  She will be weightbearing as tolerated on the right leg.  The patient maintain her surgical dressing for about 8 days after which time she will replace with gauze and tape.  The  patient keep the area dry and clean until followup.  The patient will follow up in 2 weeks at [facility] with Dr. Noel Gerold.  The patient is to call with any questions or concerns.  There is no family.  She does have some confusion with her pain medications that she should call the clinic, so we can change these medications.  DISCHARGE MEDICATIONS: 1. Aspirin enteric-coated 325 mg 1 p.o. b.i.d. for 4 weeks. 2. Benadryl 25 mg 1 p.o. q.4 hours p.r.n. 3. Colace 100 mg 1 p.o. b.i.d. constipation. 4. Iron sulfate 325 mg 1 p.o. t.i.d. for 2 to 3 weeks. 5. Robaxin 500 mg 1 p.o. q.6 hours p.r.n. muscle spasms. 6. MiraLax 17 g p.o. daily constipation. 7. Norco 5/325 one to two p.o. q.4 to 6 hours  p.r.n. pain. 8. Alprazolam 0.5 mg one p.o. q. day p.r.n. anxiety. 9. Benicar/HCT 40/12.5 mg 1 p.o. q.a.m. 10.Calcium/vitamin-D 1 p.o. b.i.d. 11.Celebrex 200 mg 1 p.o. q.a.m. 12.Citalopram 20 mg 1 p.o. q.a.m. 13.Furosemide 40 mg 1 p.o. q. day p.r.n. fluid. 14.Klor-Con 20 mEq 1 tablet p.o. q. day P.r.n. 15.Metoprolol succinate 50 mg one p.o. q.a.m. 16.Mirapex 0.5 mg one p.o. daily p.r.n. 17.Multivitamin 1 p.o. daily. 18.Ropinirole 1 mg one p.o. b.i.d. 19.Synthroid 125 mcg one p.o. q.a.m. 20.Zolpidem 10 mg one p.o. nightly p.r.n. insomnia.    ______________________________ Lanney Gins, PA   ______________________________ Madlyn Frankel. Charlann Boxer, M.D.    MB/MEDQ  D:  07/11/2011  T:  07/11/2011  Job:  [id number]  Electronically Signed by Lanney Gins PA on 07/15/2011 02:16:15 PM Electronically Signed by Durene Romans M.D. on 07/17/2011 11:13:49 AM

## 2012-05-12 ENCOUNTER — Other Ambulatory Visit: Payer: Self-pay | Admitting: Dermatology

## 2012-11-25 ENCOUNTER — Other Ambulatory Visit: Payer: Self-pay | Admitting: Orthopedic Surgery

## 2012-11-25 DIAGNOSIS — M25552 Pain in left hip: Secondary | ICD-10-CM

## 2012-11-25 DIAGNOSIS — M169 Osteoarthritis of hip, unspecified: Secondary | ICD-10-CM

## 2012-11-29 IMAGING — CR DG HIP 1V PORT*R*
1 series · 1 of 1 positions shown · non-contrast
Comparison: Portable cross-table lateral view 0085 hours correlated
with portable pelvic radiograph 07/08/2011

CLINICAL DATA: Post right hip replacement

PORTABLE RIGHT HIP - 1 VIEW

[AP]
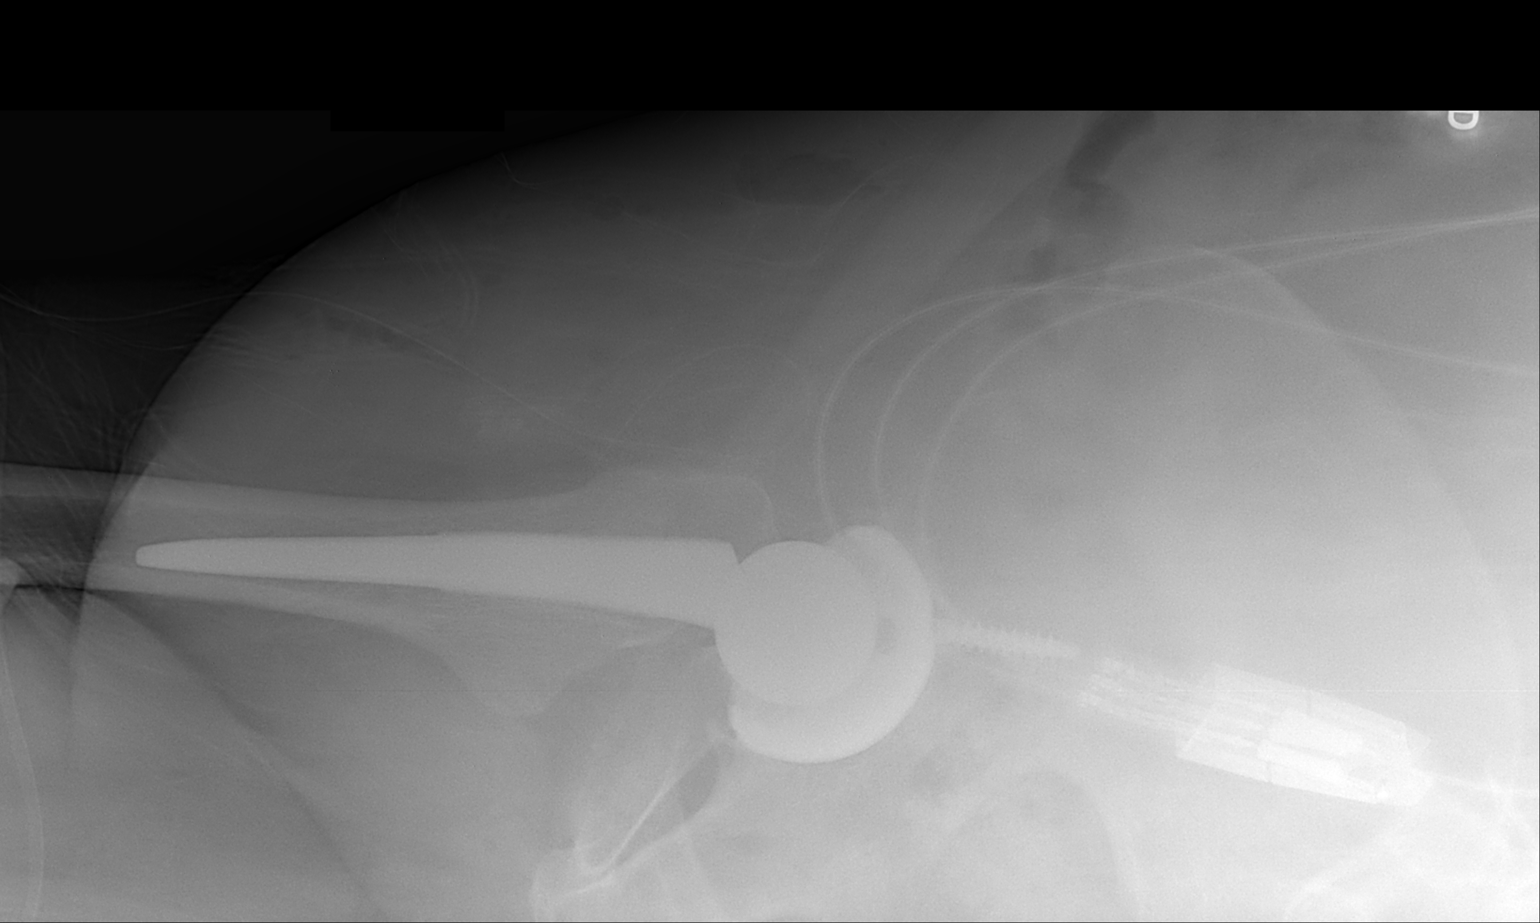

[1 of 1 positions shown; findings below may reference images not displayed]

FINDINGS: Osseous demineralization.
Components of right hip prosthesis in expected positions.
No fracture or acute complications seen.
Monitoring leads project over region.
IMPRESSION: Right hip prosthesis without acute complication on cross-table
lateral view.

## 2013-01-05 ENCOUNTER — Ambulatory Visit
Admission: RE | Admit: 2013-01-05 | Discharge: 2013-01-05 | Disposition: A | Discharge status: Home / Self Care | Payer: Medicare Other | Source: Ambulatory Visit | Attending: Orthopedic Surgery | Admitting: Orthopedic Surgery

## 2013-01-05 ENCOUNTER — Other Ambulatory Visit: Payer: Self-pay | Admitting: Orthopedic Surgery

## 2013-01-05 DIAGNOSIS — R9389 Abnormal findings on diagnostic imaging of other specified body structures: Secondary | ICD-10-CM

## 2013-01-10 ENCOUNTER — Other Ambulatory Visit: Payer: Self-pay | Admitting: Urology

## 2013-01-11 ENCOUNTER — Encounter (HOSPITAL_BASED_OUTPATIENT_CLINIC_OR_DEPARTMENT_OTHER): Payer: Self-pay | Admitting: *Deleted

## 2013-01-11 NOTE — Progress Notes (Signed)
NPO AFTER MN. ARRIVES AT 0900. NEEDS ISTAT AND EKG. CURRENT CHEST CT IN EPIC AND CHART. WILL TAKE METOPROLOL AND SYNTHROID AM OF SURG W/ SIPS OF WATER.

## 2013-01-12 ENCOUNTER — Ambulatory Visit (HOSPITAL_BASED_OUTPATIENT_CLINIC_OR_DEPARTMENT_OTHER): Payer: Medicare Other | Admitting: Anesthesiology

## 2013-01-12 ENCOUNTER — Encounter (HOSPITAL_BASED_OUTPATIENT_CLINIC_OR_DEPARTMENT_OTHER)
Admission: RE | Disposition: A | Discharge status: Home / Self Care | Payer: Self-pay | Source: Ambulatory Visit | Attending: Urology

## 2013-01-12 ENCOUNTER — Encounter (HOSPITAL_BASED_OUTPATIENT_CLINIC_OR_DEPARTMENT_OTHER): Payer: Self-pay | Admitting: Anesthesiology

## 2013-01-12 ENCOUNTER — Encounter (HOSPITAL_BASED_OUTPATIENT_CLINIC_OR_DEPARTMENT_OTHER): Payer: Self-pay | Admitting: Certified Registered"

## 2013-01-12 ENCOUNTER — Ambulatory Visit (HOSPITAL_BASED_OUTPATIENT_CLINIC_OR_DEPARTMENT_OTHER)
Admission: RE | Admit: 2013-01-12 | Discharge: 2013-01-12 | Disposition: A | Discharge status: Home / Self Care | Payer: Medicare Other | Source: Ambulatory Visit | Attending: Urology | Admitting: Urology

## 2013-01-12 DIAGNOSIS — F329 Major depressive disorder, single episode, unspecified: Secondary | ICD-10-CM | POA: Insufficient documentation

## 2013-01-12 DIAGNOSIS — I10 Essential (primary) hypertension: Secondary | ICD-10-CM | POA: Insufficient documentation

## 2013-01-12 DIAGNOSIS — F3289 Other specified depressive episodes: Secondary | ICD-10-CM | POA: Insufficient documentation

## 2013-01-12 DIAGNOSIS — F411 Generalized anxiety disorder: Secondary | ICD-10-CM | POA: Insufficient documentation

## 2013-01-12 DIAGNOSIS — Z79899 Other long term (current) drug therapy: Secondary | ICD-10-CM | POA: Insufficient documentation

## 2013-01-12 DIAGNOSIS — N1330 Unspecified hydronephrosis: Secondary | ICD-10-CM

## 2013-01-12 DIAGNOSIS — Z882 Allergy status to sulfonamides status: Secondary | ICD-10-CM | POA: Insufficient documentation

## 2013-01-12 DIAGNOSIS — K219 Gastro-esophageal reflux disease without esophagitis: Secondary | ICD-10-CM | POA: Insufficient documentation

## 2013-01-12 DIAGNOSIS — E039 Hypothyroidism, unspecified: Secondary | ICD-10-CM | POA: Insufficient documentation

## 2013-01-12 DIAGNOSIS — Q6239 Other obstructive defects of renal pelvis and ureter: Secondary | ICD-10-CM

## 2013-01-12 DIAGNOSIS — G4730 Sleep apnea, unspecified: Secondary | ICD-10-CM | POA: Insufficient documentation

## 2013-01-12 DIAGNOSIS — Z880 Allergy status to penicillin: Secondary | ICD-10-CM | POA: Insufficient documentation

## 2013-01-12 DIAGNOSIS — Z87891 Personal history of nicotine dependence: Secondary | ICD-10-CM | POA: Insufficient documentation

## 2013-01-12 HISTORY — DX: Unspecified hydronephrosis: N13.30

## 2013-01-12 HISTORY — DX: Anxiety disorder, unspecified: F41.9

## 2013-01-12 HISTORY — DX: Depression, unspecified: F32.A

## 2013-01-12 HISTORY — DX: Gastro-esophageal reflux disease without esophagitis: K21.9

## 2013-01-12 HISTORY — DX: Restless legs syndrome: G25.81

## 2013-01-12 HISTORY — DX: Unspecified osteoarthritis, unspecified site: M19.90

## 2013-01-12 HISTORY — DX: Dependence on other enabling machines and devices: Z99.89

## 2013-01-12 HISTORY — DX: Shortness of breath: R06.02

## 2013-01-12 HISTORY — DX: Other congenital malformations of ureter: Q62.8

## 2013-01-12 HISTORY — DX: Essential (primary) hypertension: I10

## 2013-01-12 HISTORY — DX: Disorder of kidney and ureter, unspecified: N28.9

## 2013-01-12 HISTORY — DX: Unspecified rotator cuff tear or rupture of right shoulder, not specified as traumatic: M75.101

## 2013-01-12 HISTORY — DX: Nocturia: R35.1

## 2013-01-12 HISTORY — DX: Hypothyroidism, unspecified: E03.9

## 2013-01-12 HISTORY — DX: Major depressive disorder, single episode, unspecified: F32.9

## 2013-01-12 HISTORY — PX: CYSTOSCOPY W/ URETERAL STENT PLACEMENT: SHX1429

## 2013-01-12 HISTORY — DX: Obstructive sleep apnea (adult) (pediatric): G47.33

## 2013-01-12 LAB — POCT I-STAT, CHEM 8
BUN: 36 mg/dL — ABNORMAL HIGH (ref 6–23)
Calcium, Ion: 1.21 mmol/L (ref 1.13–1.30)
Creatinine, Ser: 1.9 mg/dL — ABNORMAL HIGH (ref 0.50–1.10)
Glucose, Bld: 95 mg/dL (ref 70–99)
TCO2: 28 mmol/L (ref 0–100)

## 2013-01-12 SURGERY — CYSTOSCOPY, WITH RETROGRADE PYELOGRAM AND URETERAL STENT INSERTION
Anesthesia: General | Site: Ureter | Laterality: Left | Wound class: Clean Contaminated

## 2013-01-12 MED ORDER — ONDANSETRON HCL 4 MG/2ML IJ SOLN
INTRAMUSCULAR | Status: DC | PRN
  Administered 2013-01-12: 4 mg via INTRAVENOUS

## 2013-01-12 MED ORDER — LACTATED RINGERS IV SOLN
INTRAVENOUS | Status: DC
  Filled 2013-01-12: qty 1000

## 2013-01-12 MED ORDER — LACTATED RINGERS IV SOLN
INTRAVENOUS | Status: DC
  Administered 2013-01-12: 100 mL/h via INTRAVENOUS
  Filled 2013-01-12: qty 1000

## 2013-01-12 MED ORDER — IOHEXOL 350 MG/ML SOLN
INTRAVENOUS | Status: DC | PRN
  Administered 2013-01-12: 16 mL via INTRAVENOUS

## 2013-01-12 MED ORDER — CIPROFLOXACIN IN D5W 400 MG/200ML IV SOLN
400.0000 mg | INTRAVENOUS | Status: AC
  Administered 2013-01-12: 400 mg via INTRAVENOUS
  Filled 2013-01-12: qty 200

## 2013-01-12 MED ORDER — LIDOCAINE HCL (CARDIAC) 20 MG/ML IV SOLN
INTRAVENOUS | Status: DC | PRN
  Administered 2013-01-12: 50 mg via INTRAVENOUS

## 2013-01-12 MED ORDER — ALPRAZOLAM 0.5 MG PO TABS
0.5000 mg | ORAL_TABLET | Freq: Three times a day (TID) | ORAL | Status: AC | PRN
  Administered 2013-01-12: 0.5 mg via ORAL
  Filled 2013-01-12: qty 1

## 2013-01-12 MED ORDER — FENTANYL CITRATE 0.05 MG/ML IJ SOLN
INTRAMUSCULAR | Status: DC | PRN
  Administered 2013-01-12: 50 ug via INTRAVENOUS
  Administered 2013-01-12: 25 ug via INTRAVENOUS

## 2013-01-12 MED ORDER — MIDAZOLAM HCL 2 MG/2ML IJ SOLN
1.0000 mg | INTRAMUSCULAR | Status: DC | PRN
  Administered 2013-01-12: 1 mg via INTRAVENOUS
  Filled 2013-01-12: qty 1

## 2013-01-12 MED ORDER — PROMETHAZINE HCL 25 MG/ML IJ SOLN
6.2500 mg | INTRAMUSCULAR | Status: DC | PRN
  Administered 2013-01-12: 6.25 mg via INTRAVENOUS
  Filled 2013-01-12: qty 1

## 2013-01-12 MED ORDER — HYDRALAZINE HCL 20 MG/ML IJ SOLN
5.0000 mg | INTRAMUSCULAR | Status: DC
  Administered 2013-01-12 (×3): 5 mg via INTRAVENOUS
  Filled 2013-01-12: qty 0.25

## 2013-01-12 MED ORDER — SODIUM CHLORIDE 0.9 % IR SOLN
Status: DC | PRN
  Administered 2013-01-12: 3000 mL

## 2013-01-12 MED ORDER — HYDRALAZINE HCL 20 MG/ML IJ SOLN
INTRAMUSCULAR | Status: DC | PRN
  Administered 2013-01-12: 5 mg via INTRAVENOUS

## 2013-01-12 MED ORDER — FENTANYL CITRATE 0.05 MG/ML IJ SOLN
25.0000 ug | INTRAMUSCULAR | Status: DC | PRN
  Administered 2013-01-12 (×2): 25 ug via INTRAVENOUS
  Filled 2013-01-12: qty 1

## 2013-01-12 MED ORDER — PROPOFOL 10 MG/ML IV BOLUS
INTRAVENOUS | Status: DC | PRN
  Administered 2013-01-12: 160 mg via INTRAVENOUS

## 2013-01-12 MED ORDER — HYDROCODONE-ACETAMINOPHEN 5-325 MG PO TABS: 1.0000 | ORAL_TABLET | Freq: Four times a day (QID) | ORAL | Status: DC | PRN

## 2013-01-12 MED ORDER — LIDOCAINE HCL 2 % EX GEL
CUTANEOUS | Status: DC | PRN
  Administered 2013-01-12: 1

## 2013-01-12 SURGICAL SUPPLY — 29 items
ADAPTER CATH URET PLST 4-6FR (CATHETERS) IMPLANT
ADPR CATH URET STRL DISP 4-6FR (CATHETERS)
APL SKNCLS STERI-STRIP NONHPOA (GAUZE/BANDAGES/DRESSINGS)
BAG DRAIN URO-CYSTO SKYTR STRL (DRAIN) ×2 IMPLANT
BAG DRN UROCATH (DRAIN) ×1
BENZOIN TINCTURE PRP APPL 2/3 (GAUZE/BANDAGES/DRESSINGS) IMPLANT
CANISTER SUCT LVC 12 LTR MEDI- (MISCELLANEOUS) ×1 IMPLANT
CATH INTERMIT  6FR 70CM (CATHETERS) ×1 IMPLANT
CATH URET 5FR 28IN CONE TIP (BALLOONS)
CATH URET 5FR 28IN OPEN ENDED (CATHETERS) IMPLANT
CATH URET 5FR 70CM CONE TIP (BALLOONS) IMPLANT
CLOTH BEACON ORANGE TIMEOUT ST (SAFETY) ×2 IMPLANT
DRAPE CAMERA CLOSED 9X96 (DRAPES) ×2 IMPLANT
DRSG TEGADERM 2-3/8X2-3/4 SM (GAUZE/BANDAGES/DRESSINGS) IMPLANT
GLOVE BIO SURGEON STRL SZ7.5 (GLOVE) ×2 IMPLANT
GOWN STRL REIN XL XLG (GOWN DISPOSABLE) ×2 IMPLANT
GUIDEWIRE 0.038 PTFE COATED (WIRE) IMPLANT
GUIDEWIRE ANG ZIPWIRE 038X150 (WIRE) IMPLANT
GUIDEWIRE STR DUAL SENSOR (WIRE) ×1 IMPLANT
KIT BALLIN UROMAX 15FX10 (LABEL) IMPLANT
KIT BALLN UROMAX 15FX4 (MISCELLANEOUS) IMPLANT
KIT BALLN UROMAX 26 75X4 (MISCELLANEOUS)
NS IRRIG 500ML POUR BTL (IV SOLUTION) IMPLANT
PACK CYSTOSCOPY (CUSTOM PROCEDURE TRAY) ×2 IMPLANT
SET HIGH PRES BAL DIL (LABEL)
SHEATH URET ACCESS 12FR/35CM (UROLOGICAL SUPPLIES) IMPLANT
SHEATH URET ACCESS 12FR/55CM (UROLOGICAL SUPPLIES) IMPLANT
STENT 7X24 (STENTS) IMPLANT
STENT CONTOUR 7FRX24 (STENTS) ×1 IMPLANT

## 2013-01-12 NOTE — H&P (Signed)
Reason For Visit  [name] presents today with left flank pain   History of Present Illness     [name] has a history of congenital obstruction of both kidneys and has a nonfunctioning right kidney.  Her left side has shown some collecting system dilation but prompt excretion of contrast.  On a previous renal scan there was excellent function but also some potential mild partial obstruction. Creatinine in April 2011 was 0.7 and renal ultrasound showed really no evidence of obvious hydronephrosis or dilation of the left kidney.  She failed to follow up for 6 month follow ups.   Interval history: [name] presents today with left flank pain.  This has been present for about the past two days.  It is intermittent. Nothing seems to make it better or worse.  She denies any associated LUTS, dysuria, hematuria, n/v, or fever.   Current Meds 1. ALPRAZolam 0.5 MG Oral Tablet; Therapy: 11Sep2013 to 2. Calcium TABS; Therapy: (Recorded:30Nov2010) to 3. Citalopram Hydrobromide 20 MG Oral Tablet; Therapy: 29Apr2013 to 4. Fish Oil CAPS; Therapy: (Recorded:05Apr2011) to 5. Furosemide TABS; Therapy: (Recorded:30Nov2010) to 6. Glucosamine CAPS; Therapy: (Recorded:05Apr2011) to 7. Meloxicam 15 MG Oral Tablet; Therapy: 09Apr2014 to 8. Metoprolol Succinate ER 50 MG Oral Tablet Extended Release 24 Hour; Therapy: 11Feb2013 to 9. MetroNIDAZOLE 500 MG Oral Tablet; Therapy: 24Jan2014 to 10. Multi-Day TABS; Therapy: (Recorded:30Nov2010) to 11. Potassium Chloride Crys ER 20 MEQ Oral Tablet Extended Release; Therapy:   (Recorded:05Apr2011) to 12. Pramipexole Dihydrochloride 1 MG Oral Tablet; Therapy: 25Mar2013 to 13. PredniSONE (Pak) 5 MG Oral Tablet; Therapy: 25Mar2014 to 14. Red Yeast Rice CAPS; Therapy: (Recorded:05Apr2011) to 15. Requip TABS; Therapy: (Recorded:30Nov2010) to 16. ROPINIRole HCl 1 MG Oral Tablet; Therapy: 13Apr2010 to 17. Synthroid 150 MCG Oral Tablet; Therapy: 22Oct2013 to 18. Topiramate 25  MG Oral Tablet; Therapy: 17Jun2013 to 19. Zolpidem Tartrate 10 MG Oral Tablet; Therapy: 04Dec2013 to  Allergies Medication  1. Penicillins 2. Sulfa Drugs  Social History Problems    Former Smoker V15.82  Review of Systems Genitourinary, constitutional and gastrointestinal system(s) were reviewed and pertinent findings if present are noted.  Gastrointestinal: flank pain.    Vitals Vital Signs [Data Includes: Last 1 Day]  28Apr2014 01:11PM  BMI Calculated: 31.89 BSA Calculated: 1.85 Height: 5 ft 3 in Weight: 180 lb  Blood Pressure: 209 / 78 Heart Rate: 62  Physical Exam Constitutional: Well nourished and well developed . No acute distress.  Pulmonary: No respiratory distress and normal respiratory rhythm and effort.  Abdomen: The abdomen is soft and nontender. No right CVA tenderness and left CVA tenderness.  Neuro/Psych:. Mood and affect are appropriate.    Results/Data Urine [Data Includes: Last 1 Day]   28Apr2014  COLOR YELLOW   APPEARANCE CLEAR   SPECIFIC GRAVITY 1.015   pH 6.0   GLUCOSE NEG mg/dL  BILIRUBIN NEG   KETONE NEG mg/dL  BLOOD TRACE   PROTEIN NEG mg/dL  UROBILINOGEN 0.2 mg/dL  NITRITE NEG   LEUKOCYTE ESTERASE NEG   SQUAMOUS EPITHELIAL/HPF FEW   WBC 0-2 WBC/hpf  RBC 0-2 RBC/hpf  BACTERIA RARE   CRYSTALS NONE SEEN   CASTS NONE SEEN    The following images/tracing/specimen were independently visualized: .  renal u/s: I have reviewed the films and report. There were no masses, lesions, or stones seen bilaterally.  The right kidney was small and not well visualized.  The left kidney showed hydronephrosis and hydro ureter.  PVR was 113 cc.  See study sheet for details  and measurements.  CT: Scan shows worsing left hydronephrosis indicative of UPJ obstruction, but no obstructing stone seen. Right kidney is atrophic.    Assessment Assessed  1. 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  I  obtained a STAT BUN and creatinine which was 37 and 1.9 respectively.  Due to these findings, Dr. Isabel Caprice has recommended a cystoureteroscopy, retrograde pyelogram, and stent placement.  This will be performed Wednesday.  The nature of the procedure as well as the risks and benefits were discussed with [name] today. [name] is in agreement to proceed with this plan.   Plan Congenital Obstruction Of The Ureteropelvic Junction (753.21)  1. BUN & CREATININE  Done: 28Apr2014 01:25PM 2. RENAL U/S COMPLETE  Done: 28Apr2014 12:00AM 3. VENIPUNCTURE  Done: 28Apr2014 Health Maintenance (V70.0)  4. UA With REFLEX  Done: 28Apr2014 01:01PM Hydronephrosis (591)  5. AU CT-STONE PROTOCOL  Done: 28Apr2014 12:00AM   1. Follow up Wed for surgery   Signatures Electronically signed by : Denna Haggard, NP-C; Jan 11 2013  8:39AM

## 2013-01-12 NOTE — Interval H&P Note (Signed)
History and Physical Interval Note:  01/12/2013 9:58 AM  [name]  has presented today for surgery, with the diagnosis of LEFT HYDRONEPHROSIS  The various methods of treatment have been discussed with the patient and family. After consideration of risks, benefits and other options for treatment, the patient has consented to  Procedure(s): CYSTOSCOPY WITH RETROGRADE PYELOGRAM/URETERAL STENT PLACEMENT (Left) as a surgical intervention .  The patient's history has been reviewed, patient examined, no change in status, stable for surgery.  I have reviewed the patient's chart and labs.  Questions were answered to the patient's satisfaction.     Lamario Mani S

## 2013-01-12 NOTE — Progress Notes (Signed)
Called at about 1240 for patient having feelings of panic. H/O anxiety. Takes Xanax. Last dose of xanax was 01-11-13. Blood pressure is up at 185/85. She has received a total of 20 mg hydralazine for this. Hydralazine 5 mg IV intraop, and 15 mg Hydralazine in PACU. Blood pressure was up preoperatively as well at 200/90. She did take her toprol this AM. No shortness of breath.  Complains of some chest pressure and so ECG ordered which is unchanged: Sinus bradycardia at 59.  A/P: Anxiety. Versed ordered.

## 2013-01-12 NOTE — Op Note (Signed)
Preoperative diagnosis: Solitary functioning left kidney, left hydronephrosis secondary to UPJ obstruction and acute renal insufficiency Postoperative diagnosis: Same  Procedure: Cystoscopy, left retrograde pyelogram, left double-J stent insertion 7 French x24 cm   Surgeon: Valetta Fuller M.D.  Anesthesia: Gen.  Indications: Ms. Glasner has a solitary functioning left kidney. She has been known to have what appears to be a partial UPJ obstruction on the left. Renal function was normal and she was asymptomatic with reasonable drainage of the kidney on previous assessment. Recently she presented with several weeks of intermittent left flank pain with increased fluid retention. She was noted to have an elevated creatinine of 1.9. Ultrasound confirmed a substantial increase in dilation of the left renal pelvis and collecting system. Stone protocol CT did not show an obvious stone it appeared to be consistent with a UPJ obstruction. Patient now presents for retrograde assessment and lasering of double-J stent. She's been noted to have a substantial increase in her blood pressure.     Technique and findings: Patient was brought the operating room where she had successful induction general anesthesia. She was placed in lithotomy position prepped draped usual manner. She received perioperative antibiotics and had placement of PAS compression boots. Appropriate surgical timeout was performed. Cystoscopy was performed with findings of an unremarkable urethra and bladder. The left ureteral orifice was normal in appearance and location. An open-ended catheter was inserted and retrograde pyelography was done with fluoroscopic interpretation.  The patient was noted to have some narrowing and kinking of her ureter just beneath the ureteropelvic junction. There then appeared to be a jet effect of contrast entering a very dilated renal pelvis on the left. The insertion point was difficult to determine exactly. A guidewire  was able to be placed through this area into the dilated renal pelvis and a 7 French 24 cm stent was placed with fluoroscopic as well as visual guidance. The patient no obvious complications and was brought to recovery room stable condition having had no obvious complications

## 2013-01-12 NOTE — Anesthesia Procedure Notes (Signed)
Procedure Name: LMA Insertion Date/Time: 01/12/2013 10:22 AM Performed by: Renella Cunas D Pre-anesthesia Checklist: Patient identified, Emergency Drugs available, Suction available and Patient being monitored Patient Re-evaluated:Patient Re-evaluated prior to inductionOxygen Delivery Method: Circle System Utilized Preoxygenation: Pre-oxygenation with 100% oxygen Intubation Type: IV induction Ventilation: Mask ventilation without difficulty LMA: LMA inserted and LMA with gastric port inserted LMA Size: 4.0 Number of attempts: 1 Airway Equipment and Method: bite block Placement Confirmation: positive ETCO2 Tube secured with: Tape Dental Injury: Teeth and Oropharynx as per pre-operative assessment  Comments: Initial o2 sat 91.  #4 LMA inserted with poor seal.  #4 supreme LMA inserted with good seal.

## 2013-01-12 NOTE — Transfer of Care (Signed)
Immediate Anesthesia Transfer of Care Note  Patient: [name]  Procedure(s) Performed: Procedure(s) (LRB): CYSTOSCOPY WITH RETROGRADE PYELOGRAM/URETERAL STENT PLACEMENT (Left)  Patient Location: PACU  Anesthesia Type: General  Level of Consciousness: awake, oriented, sedated and patient cooperative  Airway & Oxygen Therapy: Patient Spontanous Breathing and Patient connected to face mask oxygen  Post-op Assessment: Report given to PACU RN and Post -op Vital signs reviewed and stable  Post vital signs: Reviewed and stable  Complications: No apparent anesthesia complications

## 2013-01-12 NOTE — Anesthesia Preprocedure Evaluation (Addendum)
Anesthesia Evaluation  Patient identified by MRN, date of birth, ID band Patient awake    Reviewed: Allergy & Precautions, H&P , NPO status , Patient's Chart, lab work & pertinent test results  History of Anesthesia Complications (+) PONV  Airway Mallampati: II TM Distance: >3 FB Neck ROM: Full    Dental  (+) Teeth Intact, Dental Advisory Given and Caps   Pulmonary shortness of breath and with exertion, sleep apnea and Continuous Positive Airway Pressure Ventilation , former smoker,  breath sounds clear to auscultation  Pulmonary exam normal       Cardiovascular hypertension, Pt. on medications Rhythm:Regular Rate:Normal     Neuro/Psych Anxiety Depression negative neurological ROS  negative psych ROS   GI/Hepatic Neg liver ROS, GERD-  ,  Endo/Other  negative endocrine ROSHypothyroidism   Renal/GU Renal diseasenegative Renal ROS  negative genitourinary   Musculoskeletal negative musculoskeletal ROS (+)   Abdominal   Peds  Hematology negative hematology ROS (+)   Anesthesia Other Findings   Reproductive/Obstetrics                          Anesthesia Physical Anesthesia Plan  ASA: III  Anesthesia Plan: General   Post-op Pain Management:    Induction: Intravenous  Airway Management Planned: LMA  Additional Equipment:   Intra-op Plan:   Post-operative Plan: Extubation in OR  Informed Consent: I have reviewed the patients History and Physical, chart, labs and discussed the procedure including the risks, benefits and alternatives for the proposed anesthesia with the patient or authorized representative who has indicated his/her understanding and acceptance.   Dental advisory given  Plan Discussed with: CRNA  Anesthesia Plan Comments:         Anesthesia Quick Evaluation

## 2013-01-12 NOTE — Progress Notes (Signed)
Pt reports of intermittent  indigestion w SOB. Pt reports this feels like when she has  her panic attacks.

## 2013-01-13 ENCOUNTER — Encounter (HOSPITAL_BASED_OUTPATIENT_CLINIC_OR_DEPARTMENT_OTHER): Payer: Self-pay | Admitting: Urology

## 2013-01-13 NOTE — Anesthesia Postprocedure Evaluation (Signed)
Anesthesia Post Note  Patient: [name]  Procedure(s) Performed: Procedure(s) (LRB): CYSTOSCOPY WITH RETROGRADE PYELOGRAM/URETERAL STENT PLACEMENT (Left)  Anesthesia type: General  Patient location: PACU  Post pain: Pain level controlled  Post assessment: Post-op Vital signs reviewed  Last Vitals:  Filed Vitals:   01/12/13 1459  BP: 187/78  Pulse: 59  Temp: 36.2 C  Resp: 16    Post vital signs: Reviewed  Level of consciousness: sedated  Complications: No apparent anesthesia complications

## 2013-01-13 NOTE — Progress Notes (Signed)
Pt encouraged to follow d/c instructions regarding her meds.  Husband states he plans to call her internist today.

## 2013-01-13 NOTE — Addendum Note (Signed)
Addendum created 01/13/13 1450 by Fran Lowes, CRNA   Modules edited: Charges VN

## 2013-02-04 ENCOUNTER — Other Ambulatory Visit: Payer: Self-pay | Admitting: Urology

## 2013-02-22 ENCOUNTER — Encounter (HOSPITAL_COMMUNITY): Payer: Self-pay | Admitting: Pharmacy Technician

## 2013-02-24 ENCOUNTER — Other Ambulatory Visit (HOSPITAL_COMMUNITY): Payer: Self-pay | Admitting: Interventional Cardiology

## 2013-02-24 DIAGNOSIS — I10 Essential (primary) hypertension: Secondary | ICD-10-CM

## 2013-02-24 DIAGNOSIS — I2789 Other specified pulmonary heart diseases: Secondary | ICD-10-CM

## 2013-02-28 ENCOUNTER — Ambulatory Visit (HOSPITAL_COMMUNITY)
Admission: RE | Admit: 2013-02-28 | Discharge: 2013-02-28 | Disposition: A | Discharge status: Home / Self Care | Payer: Medicare Other | Source: Ambulatory Visit | Attending: Interventional Cardiology | Admitting: Interventional Cardiology

## 2013-02-28 ENCOUNTER — Encounter (HOSPITAL_COMMUNITY)
Admission: RE | Admit: 2013-02-28 | Discharge: 2013-02-28 | Disposition: A | Discharge status: Home / Self Care | Payer: Medicare Other | Source: Ambulatory Visit | Attending: Urology | Admitting: Urology

## 2013-02-28 ENCOUNTER — Encounter (HOSPITAL_COMMUNITY): Payer: Self-pay

## 2013-02-28 DIAGNOSIS — N1330 Unspecified hydronephrosis: Secondary | ICD-10-CM | POA: Insufficient documentation

## 2013-02-28 DIAGNOSIS — I10 Essential (primary) hypertension: Secondary | ICD-10-CM | POA: Insufficient documentation

## 2013-02-28 DIAGNOSIS — K219 Gastro-esophageal reflux disease without esophagitis: Secondary | ICD-10-CM | POA: Insufficient documentation

## 2013-02-28 DIAGNOSIS — Z01818 Encounter for other preprocedural examination: Secondary | ICD-10-CM | POA: Insufficient documentation

## 2013-02-28 DIAGNOSIS — E039 Hypothyroidism, unspecified: Secondary | ICD-10-CM | POA: Insufficient documentation

## 2013-02-28 DIAGNOSIS — Z01810 Encounter for preprocedural cardiovascular examination: Secondary | ICD-10-CM | POA: Insufficient documentation

## 2013-02-28 DIAGNOSIS — Q6239 Other obstructive defects of renal pelvis and ureter: Secondary | ICD-10-CM | POA: Insufficient documentation

## 2013-02-28 DIAGNOSIS — I2789 Other specified pulmonary heart diseases: Secondary | ICD-10-CM

## 2013-02-28 HISTORY — DX: Adverse effect of unspecified anesthetic, initial encounter: T41.45XA

## 2013-02-28 HISTORY — DX: Other complications of anesthesia, initial encounter: T88.59XA

## 2013-02-28 LAB — BASIC METABOLIC PANEL
CO2: 29 mEq/L (ref 19–32)
Chloride: 93 mEq/L — ABNORMAL LOW (ref 96–112)
Creatinine, Ser: 1.13 mg/dL — ABNORMAL HIGH (ref 0.50–1.10)

## 2013-02-28 LAB — CBC
HCT: 36.4 % (ref 36.0–46.0)
MCV: 90.1 fL (ref 78.0–100.0)
RDW: 13.2 % (ref 11.5–15.5)
WBC: 11 10*3/uL — ABNORMAL HIGH (ref 4.0–10.5)

## 2013-02-28 LAB — URINALYSIS, ROUTINE W REFLEX MICROSCOPIC
Glucose, UA: NEGATIVE mg/dL
Hgb urine dipstick: NEGATIVE
Ketones, ur: NEGATIVE mg/dL
Protein, ur: NEGATIVE mg/dL
Urobilinogen, UA: 0.2 mg/dL (ref 0.0–1.0)

## 2013-02-28 NOTE — Progress Notes (Signed)
  Echocardiogram 2D Echocardiogram has been performed.  Timi Reeser, [facility] 02/28/2013, 11:29 AM

## 2013-02-28 NOTE — Patient Instructions (Addendum)
[name]  02/28/2013                           YOUR PROCEDURE IS SCHEDULED ON: 03/07/13               PLEASE REPORT TO SHORT STAY CENTER AT : 5:15 AM               CALL THIS NUMBER IF ANY PROBLEMS THE DAY OF SURGERY :               [phone]                      REMEMBER:   Do not eat food or drink liquids AFTER MIDNIGHT   Take these medicines the morning of surgery with A SIP OF WATER: CITALOPRAM / SYNTHROID / METOPROLOL / PRILOSEC / MAY TAKE XANAX OR HYDROCODONE IF NEEDED   Do not wear jewelry, make-up   Do not wear lotions, powders, or perfumes.   Do not shave legs or underarms 12 hrs. before surgery (men may shave face)  Do not bring valuables to the hospital.  Contacts, dentures or bridgework may not be worn into surgery.  Leave suitcase in the car. After surgery it may be brought to your room.  For patients admitted to the hospital more than one night, checkout time is 11:00                          The day of discharge.   Patients discharged the day of surgery will not be allowed to drive home                             If going home same day of surgery, must have someone stay with you first                           24 hrs at home and arrange for some one to drive you home from hospital.    Special Instructions:   Please read over the following fact sheets that you were given:               1. MRSA  INFORMATION                      2. [facility] PREPARING FOR SURGERY SHEET               3. INCENTIVE SPIROMETER               4. BRING C-PAP MASK AND TUBING TO HOSPITAL               5. FOLLOW BOWEL PREP INSTRUCTIONS FROM OFFICE                                                X_____________________________________________________________________        Failure to follow these instructions may result in cancellation of your surgery

## 2013-02-28 NOTE — Progress Notes (Signed)
CBC / BMET routed to Dr. Laverle Patter

## 2013-03-04 NOTE — H&P (Signed)
Chief Complaint  Left UPJ obstruction and solitary functional left kidney    History of Present Illness  [name] is a 77 year old with a reported history of bilateral UPJ obstruction s/p bilateral open flank repair in her 95s. She was asymptomatic for 50 years and eventually developed a non-functional right kidney with estimated 5% relative function on renogram imaging in 2011. She was seen by Dr. Isabel Caprice at that time for unclear reasons and was asymptomatic with normal global renal function despite left hydronephrosis. She was recommended to continue follow up but did not follow up as recommended and eventually presented in April 2014 with complaints of intermittent moderate left flank pain controlled with ibuprofen. A CT stone study at that time demonstrated progressive left hydronephrosis compared to prior imaging studies.  No ureteral dilation was noted and there is suggestion of a possible small accessory renal artery. Her right kidney was severely atrophic. Her Scr at that time had increased to 1.9 from a baseline of 0.7 in 2011. She was taken to the OR by Dr. Isabel Caprice and retrograde pyelography revealed a normal appearing ureter with a tortuous proximal ureter and hydronephrosis consistent with UPJ obstruction. No intraluminal filling defect were identified. He placed a ureteral stent with complete relief of her pain and improvement of her renal function with a nadir Scr of 1.3.  Her comorbidities include a history of sleep apnea, hypertension, hypercholesterolemia, and gastroesophageal reflux, and hypothyroidism. She did have a history of atypical chest pain a few years ago and underwent an evaluation by Dr. Garnette Scheuermann which included a cardiac catheterization and was reportedly unremarkable. She does describe progressive dyspnea with exertion over the past few years. She states that it is difficult for her to climb a flight of stairs without becoming significantly short of breath. She has not had any  chest pain but has described intermittent bilateral lower extremity over the past few years.     Past Medical History Problems  1. History of  Adult Sleep Apnea 780.57 2. History of  Anxiety Disorder NOS 300.00 3. History of  Esophageal Reflux 530.81 4. History of  Hypercholesterolemia 272.0 5. History of  Hypertension 401.9 6. History of  Hyperthyroidism 242.90   She does use a CPAP machine for treatment of her sleep apnea.   Surgical History Problems  1. History of  Abdominoplasty 2. History of  Appendectomy 3. History of  Cystoscopy With Insertion Of Ureteral Stent Left 4. History of  Hysterectomy V45.77 5. History of  Pyeloplasty Bilateral 6. History of  Total Hip Replacement 7. History of  Tubal Ligation V25.2  Current Meds 1. ALPRAZolam 0.5 MG Oral Tablet; Therapy: 11Sep2013 to 2. Calcium TABS; Therapy: (Recorded:30Nov2010) to 3. Citalopram Hydrobromide 20 MG Oral Tablet; Therapy: 29Apr2013 to 4. Furosemide TABS; Therapy: (Recorded:30Nov2010) to 5. Meloxicam 15 MG Oral Tablet; Therapy: 09Apr2014 to 6. Metoprolol Succinate ER 50 MG Oral Tablet Extended Release 24 Hour; Therapy: 11Feb2013 to 7. Potassium Chloride Crys ER 20 MEQ Oral Tablet Extended Release; Therapy:  (Recorded:05Apr2011) to 8. Requip TABS; Therapy: (Recorded:30Nov2010) to 9. ROPINIRole HCl 1 MG Oral Tablet; Therapy: 13Apr2010 to 10. Synthroid 150 MCG Oral Tablet; Therapy: 22Oct2013 to 11. Zolpidem Tartrate 10 MG Oral Tablet; Therapy: 04Dec2013 to  Allergies Medication  1. Penicillins 2. Sulfa Drugs   Penicillin has caused a rash in the past. She does not describe a reaction to cephalosporin medications.   Family History Denied  1. Family history of  End Stage Renal Disease 2. Family history  of  Kidney Cancer  Social History Problems    Former Smoker V15.82  Review of Systems Constitutional, skin, eye, otolaryngeal, hematologic/lymphatic, cardiovascular, pulmonary, endocrine,  musculoskeletal, gastrointestinal, neurological and psychiatric system(s) were reviewed and pertinent findings if present are noted.  Gastrointestinal: flank pain, but no nausea and no vomiting.  Constitutional: no fever and no recent weight loss.  Hematologic/Lymphatic: no tendency to easily bruise.  Cardiovascular: leg swelling, but no chest pain.  Respiratory: shortness of breath and shortness of breath during exertion, but no orthopnea.      Physical Exam Constitutional: Well nourished and well developed . No acute distress.  ENT:. The ears and nose are normal in appearance.  Neck: The appearance of the neck is normal and no neck mass is present.  Pulmonary: No respiratory distress, normal respiratory rhythm and effort and clear bilateral breath sounds.  Cardiovascular: Heart rate and rhythm are normal . No peripheral edema.  Abdomen: right flank, right lower quadrant, left flank, Pfannensteil incision site(s) well healed. The abdomen is soft and nontender. No masses are palpated. No CVA tenderness. No hernias are palpable. No hepatosplenomegaly noted.  Lymphatics: The femoral and inguinal nodes are not enlarged or tender.  Skin: Normal skin turgor, no visible rash and no visible skin lesions.  Neuro/Psych:. Mood and affect are appropriate.        Discussion/Summary 1. Left ureteropelvic junction obstruction with a functional left solitary kidney:  [name] is interested in proceeding with definitive surgical therapy with a pyeloplasty. She has been evaluated by Dr. Katrinka Blazing and she did not appear to have significant coronary artery disease as an explanation for her dyspnea. Furthermore, she maintained her oxygen saturations with activity.  She is felt to be a moderate but acceptable perioperative risk from a cardiac standpoint (1-5%). She has elected to undergo a left robotic-assisted laparoscopic pyeloplasty. She understands the increased risk for open surgical conversion as well as  the increased risks associated with operating on a solitary kidney including the possible loss of this kidney possibly resulting in need for dialysis. We have discussed the potential risk of bleeding, infection, heart attack, stroke, death, venous thromboembolism, et Karie Soda. We have discussed the potential risks of damage to adjacent structures including but not limited to the spleen, pancreas, colon, small intestine, blood vessels/nerves, kidney, etc. We also discussed the potential risk of urine leak and the possible risk of failure of the procedure. Considering she has already undergone a prior pyeloplasty, I quoted her an approximately 75-85% chance for success with this approach. She understands that if she develops repeat obstruction that she would at that point require ongoing chronic stent management. She expressed understanding of these risks and does wish to proceed.

## 2013-03-07 ENCOUNTER — Inpatient Hospital Stay (HOSPITAL_COMMUNITY): Payer: Medicare Other | Admitting: Registered Nurse

## 2013-03-07 ENCOUNTER — Encounter (HOSPITAL_COMMUNITY)
Admission: RE | Disposition: A | Discharge status: Home / Self Care | Payer: Self-pay | Source: Ambulatory Visit | Attending: Urology

## 2013-03-07 ENCOUNTER — Inpatient Hospital Stay (HOSPITAL_COMMUNITY): Payer: Medicare Other

## 2013-03-07 ENCOUNTER — Encounter (HOSPITAL_COMMUNITY): Payer: Self-pay | Admitting: *Deleted

## 2013-03-07 ENCOUNTER — Encounter (HOSPITAL_COMMUNITY): Payer: Self-pay | Admitting: Registered Nurse

## 2013-03-07 ENCOUNTER — Inpatient Hospital Stay (HOSPITAL_COMMUNITY)
Admission: RE | Admit: 2013-03-07 | Discharge: 2013-03-09 | DRG: 660 | Disposition: A | Discharge status: Home / Self Care | Payer: Medicare Other | Source: Ambulatory Visit | Attending: Urology | Admitting: Urology

## 2013-03-07 DIAGNOSIS — N135 Crossing vessel and stricture of ureter without hydronephrosis: Principal | ICD-10-CM | POA: Diagnosis present

## 2013-03-07 DIAGNOSIS — Z841 Family history of disorders of kidney and ureter: Secondary | ICD-10-CM

## 2013-03-07 DIAGNOSIS — I10 Essential (primary) hypertension: Secondary | ICD-10-CM | POA: Diagnosis present

## 2013-03-07 DIAGNOSIS — Q602 Renal agenesis, unspecified: Secondary | ICD-10-CM

## 2013-03-07 DIAGNOSIS — K660 Peritoneal adhesions (postprocedural) (postinfection): Secondary | ICD-10-CM | POA: Diagnosis present

## 2013-03-07 DIAGNOSIS — Z96649 Presence of unspecified artificial hip joint: Secondary | ICD-10-CM

## 2013-03-07 DIAGNOSIS — E039 Hypothyroidism, unspecified: Secondary | ICD-10-CM | POA: Diagnosis present

## 2013-03-07 DIAGNOSIS — K219 Gastro-esophageal reflux disease without esophagitis: Secondary | ICD-10-CM | POA: Diagnosis present

## 2013-03-07 DIAGNOSIS — Z87891 Personal history of nicotine dependence: Secondary | ICD-10-CM

## 2013-03-07 DIAGNOSIS — G4733 Obstructive sleep apnea (adult) (pediatric): Secondary | ICD-10-CM | POA: Diagnosis present

## 2013-03-07 DIAGNOSIS — E7800 Pure hypercholesterolemia, unspecified: Secondary | ICD-10-CM | POA: Diagnosis present

## 2013-03-07 HISTORY — PX: ROBOT ASSISTED PYELOPLASTY: SHX5143

## 2013-03-07 LAB — BASIC METABOLIC PANEL
BUN: 10 mg/dL (ref 6–23)
Calcium: 9.3 mg/dL (ref 8.4–10.5)
Creatinine, Ser: 0.91 mg/dL (ref 0.50–1.10)
GFR calc non Af Amer: 61 mL/min — ABNORMAL LOW (ref >90)
Glucose, Bld: 131 mg/dL — ABNORMAL HIGH (ref 70–99)

## 2013-03-07 LAB — TYPE AND SCREEN

## 2013-03-07 SURGERY — ROBOTIC ASSISTED PYELOPLASTY
Anesthesia: General | Laterality: Left | Wound class: Clean

## 2013-03-07 MED ORDER — BUPIVACAINE LIPOSOME 1.3 % IJ SUSP
20.0000 mL | Freq: Once | INTRAMUSCULAR | Status: DC
  Filled 2013-03-07: qty 20

## 2013-03-07 MED ORDER — PANTOPRAZOLE SODIUM 40 MG PO TBEC
40.0000 mg | DELAYED_RELEASE_TABLET | Freq: Every day | ORAL | Status: DC
  Administered 2013-03-07 – 2013-03-09 (×3): 40 mg via ORAL
  Filled 2013-03-07 (×3): qty 1

## 2013-03-07 MED ORDER — KCL IN DEXTROSE-NACL 20-5-0.45 MEQ/L-%-% IV SOLN
INTRAVENOUS | Status: AC
  Administered 2013-03-07: 1000 mL
  Filled 2013-03-07: qty 1000

## 2013-03-07 MED ORDER — ROPINIROLE HCL 1 MG PO TABS
2.0000 mg | ORAL_TABLET | Freq: Every evening | ORAL | Status: DC | PRN
  Filled 2013-03-07: qty 2

## 2013-03-07 MED ORDER — ONDANSETRON HCL 4 MG/2ML IJ SOLN
INTRAMUSCULAR | Status: DC | PRN
  Administered 2013-03-07: 4 mg via INTRAVENOUS

## 2013-03-07 MED ORDER — ZOLPIDEM TARTRATE 5 MG PO TABS
5.0000 mg | ORAL_TABLET | Freq: Every day | ORAL | Status: DC
  Administered 2013-03-07 – 2013-03-08 (×2): 5 mg via ORAL
  Filled 2013-03-07 (×2): qty 1

## 2013-03-07 MED ORDER — LACTATED RINGERS IV SOLN
INTRAVENOUS | Status: DC | PRN
  Administered 2013-03-07 (×2): via INTRAVENOUS

## 2013-03-07 MED ORDER — DIPHENHYDRAMINE HCL 50 MG/ML IJ SOLN: 12.5000 mg | Freq: Four times a day (QID) | INTRAMUSCULAR | Status: DC | PRN

## 2013-03-07 MED ORDER — FUROSEMIDE 40 MG PO TABS
40.0000 mg | ORAL_TABLET | Freq: Every day | ORAL | Status: DC | PRN
  Filled 2013-03-07: qty 1

## 2013-03-07 MED ORDER — DOCUSATE SODIUM 100 MG PO CAPS
100.0000 mg | ORAL_CAPSULE | Freq: Two times a day (BID) | ORAL | Status: DC
  Administered 2013-03-08: 100 mg via ORAL
  Filled 2013-03-07 (×5): qty 1

## 2013-03-07 MED ORDER — HYDROCHLOROTHIAZIDE 12.5 MG PO CAPS
12.5000 mg | ORAL_CAPSULE | Freq: Every day | ORAL | Status: DC
  Administered 2013-03-07 – 2013-03-09 (×3): 12.5 mg via ORAL
  Filled 2013-03-07 (×3): qty 1

## 2013-03-07 MED ORDER — METOPROLOL SUCCINATE ER 50 MG PO TB24
50.0000 mg | ORAL_TABLET | Freq: Every morning | ORAL | Status: DC
  Administered 2013-03-08 – 2013-03-09 (×2): 50 mg via ORAL
  Filled 2013-03-07 (×2): qty 1

## 2013-03-07 MED ORDER — LIDOCAINE HCL (CARDIAC) 20 MG/ML IV SOLN
INTRAVENOUS | Status: DC | PRN
  Administered 2013-03-07: 80 mg via INTRAVENOUS

## 2013-03-07 MED ORDER — IRBESARTAN 300 MG PO TABS
300.0000 mg | ORAL_TABLET | Freq: Every day | ORAL | Status: DC
  Administered 2013-03-07 – 2013-03-09 (×3): 300 mg via ORAL
  Filled 2013-03-07 (×3): qty 1

## 2013-03-07 MED ORDER — PRAMIPEXOLE DIHYDROCHLORIDE 1 MG PO TABS
2.0000 mg | ORAL_TABLET | Freq: Every evening | ORAL | Status: DC
  Administered 2013-03-07 – 2013-03-08 (×2): 2 mg via ORAL
  Filled 2013-03-07 (×3): qty 2

## 2013-03-07 MED ORDER — LACTATED RINGERS IR SOLN
Status: DC | PRN
  Administered 2013-03-07: 1000 mL

## 2013-03-07 MED ORDER — ROCURONIUM BROMIDE 100 MG/10ML IV SOLN
INTRAVENOUS | Status: DC | PRN
  Administered 2013-03-07: 30 mg via INTRAVENOUS
  Administered 2013-03-07: 20 mg via INTRAVENOUS

## 2013-03-07 MED ORDER — SODIUM CHLORIDE 0.9 % IJ SOLN
INTRAMUSCULAR | Status: DC | PRN
  Administered 2013-03-07: 11:00:00

## 2013-03-07 MED ORDER — MIDAZOLAM HCL 5 MG/5ML IJ SOLN
INTRAMUSCULAR | Status: DC | PRN
  Administered 2013-03-07: 2 mg via INTRAVENOUS

## 2013-03-07 MED ORDER — FENTANYL CITRATE 0.05 MG/ML IJ SOLN
INTRAMUSCULAR | Status: DC | PRN
  Administered 2013-03-07 (×2): 50 ug via INTRAVENOUS
  Administered 2013-03-07: 100 ug via INTRAVENOUS
  Administered 2013-03-07: 50 ug via INTRAVENOUS

## 2013-03-07 MED ORDER — PROMETHAZINE HCL 25 MG/ML IJ SOLN: 6.2500 mg | INTRAMUSCULAR | Status: DC | PRN

## 2013-03-07 MED ORDER — ACETAMINOPHEN 325 MG PO TABS: 650.0000 mg | ORAL_TABLET | ORAL | Status: DC | PRN

## 2013-03-07 MED ORDER — MORPHINE SULFATE 2 MG/ML IJ SOLN
2.0000 mg | INTRAMUSCULAR | Status: DC | PRN
  Administered 2013-03-07: 4 mg via INTRAVENOUS
  Filled 2013-03-07: qty 2

## 2013-03-07 MED ORDER — ACETAMINOPHEN 10 MG/ML IV SOLN
1000.0000 mg | Freq: Four times a day (QID) | INTRAVENOUS | Status: DC
  Administered 2013-03-07 – 2013-03-08 (×3): 1000 mg via INTRAVENOUS
  Filled 2013-03-07 (×4): qty 100

## 2013-03-07 MED ORDER — CITALOPRAM HYDROBROMIDE 20 MG PO TABS
20.0000 mg | ORAL_TABLET | Freq: Every morning | ORAL | Status: DC
  Administered 2013-03-08 – 2013-03-09 (×2): 20 mg via ORAL
  Filled 2013-03-07 (×2): qty 1

## 2013-03-07 MED ORDER — PROPOFOL 10 MG/ML IV BOLUS
INTRAVENOUS | Status: DC | PRN
  Administered 2013-03-07: 150 mg via INTRAVENOUS

## 2013-03-07 MED ORDER — HYDROMORPHONE HCL PF 1 MG/ML IJ SOLN
0.2500 mg | INTRAMUSCULAR | Status: DC | PRN
  Administered 2013-03-07 (×2): 0.5 mg via INTRAVENOUS

## 2013-03-07 MED ORDER — ALPRAZOLAM 0.5 MG PO TABS: 0.5000 mg | ORAL_TABLET | Freq: Three times a day (TID) | ORAL | Status: DC | PRN

## 2013-03-07 MED ORDER — HYDROMORPHONE HCL PF 1 MG/ML IJ SOLN
INTRAMUSCULAR | Status: DC | PRN
  Administered 2013-03-07: .5 mg via INTRAVENOUS
  Administered 2013-03-07: 0.5 mg via INTRAVENOUS

## 2013-03-07 MED ORDER — KETOROLAC TROMETHAMINE 30 MG/ML IJ SOLN
30.0000 mg | Freq: Four times a day (QID) | INTRAMUSCULAR | Status: DC
  Administered 2013-03-07: 30 mg via INTRAVENOUS

## 2013-03-07 MED ORDER — SUCCINYLCHOLINE CHLORIDE 20 MG/ML IJ SOLN
INTRAMUSCULAR | Status: DC | PRN
  Administered 2013-03-07: 100 mg via INTRAVENOUS

## 2013-03-07 MED ORDER — GLYCOPYRROLATE 0.2 MG/ML IJ SOLN
INTRAMUSCULAR | Status: DC | PRN
  Administered 2013-03-07: .8 mg via INTRAVENOUS

## 2013-03-07 MED ORDER — KCL IN DEXTROSE-NACL 20-5-0.45 MEQ/L-%-% IV SOLN
INTRAVENOUS | Status: DC
  Administered 2013-03-07 (×2): via INTRAVENOUS
  Filled 2013-03-07 (×4): qty 1000

## 2013-03-07 MED ORDER — WATER FOR IRRIGATION, STERILE IR SOLN
Status: DC | PRN
  Administered 2013-03-07: 1500 mL

## 2013-03-07 MED ORDER — LEVOTHYROXINE SODIUM 150 MCG PO TABS
150.0000 ug | ORAL_TABLET | Freq: Every day | ORAL | Status: DC
  Administered 2013-03-08 – 2013-03-09 (×2): 150 ug via ORAL
  Filled 2013-03-07 (×4): qty 1

## 2013-03-07 MED ORDER — OLMESARTAN MEDOXOMIL-HCTZ 40-12.5 MG PO TABS: 1.0000 | ORAL_TABLET | Freq: Every morning | ORAL | Status: DC

## 2013-03-07 MED ORDER — INDIGOTINDISULFONATE SODIUM 8 MG/ML IJ SOLN
INTRAMUSCULAR | Status: DC | PRN
  Administered 2013-03-07: 5 mL via INTRAVENOUS

## 2013-03-07 MED ORDER — ONDANSETRON HCL 4 MG/2ML IJ SOLN
4.0000 mg | INTRAMUSCULAR | Status: DC | PRN
  Administered 2013-03-08: 4 mg via INTRAVENOUS
  Filled 2013-03-07: qty 2

## 2013-03-07 MED ORDER — DIPHENHYDRAMINE HCL 12.5 MG/5ML PO ELIX: 12.5000 mg | ORAL_SOLUTION | Freq: Four times a day (QID) | ORAL | Status: DC | PRN

## 2013-03-07 MED ORDER — CEFAZOLIN SODIUM 1-5 GM-% IV SOLN
1.0000 g | Freq: Three times a day (TID) | INTRAVENOUS | Status: AC
  Administered 2013-03-07 (×2): 1 g via INTRAVENOUS
  Filled 2013-03-07 (×2): qty 50

## 2013-03-07 MED ORDER — SALINE NASAL SPRAY 0.65 % NA SOLN
1.0000 | NASAL | Status: DC | PRN
  Filled 2013-03-07: qty 0.5

## 2013-03-07 MED ORDER — EPHEDRINE SULFATE 50 MG/ML IJ SOLN
INTRAMUSCULAR | Status: DC | PRN
  Administered 2013-03-07: 5 mg via INTRAVENOUS

## 2013-03-07 MED ORDER — NEOSTIGMINE METHYLSULFATE 1 MG/ML IJ SOLN
INTRAMUSCULAR | Status: DC | PRN
  Administered 2013-03-07: 5 mg via INTRAVENOUS

## 2013-03-07 MED ORDER — SODIUM CHLORIDE 0.9 % IV BOLUS (SEPSIS)
500.0000 mL | Freq: Once | INTRAVENOUS | Status: AC
  Administered 2013-03-07: 500 mL via INTRAVENOUS

## 2013-03-07 SURGICAL SUPPLY — 63 items
ADAPTER CATH URET PLST 4-6FR (CATHETERS) ×2 IMPLANT
ADH SKN CLS APL DERMABOND .7 (GAUZE/BANDAGES/DRESSINGS) ×1
ADPR CATH URET STRL DISP 4-6FR (CATHETERS) ×1
BAG URO CATCHER STRL LF (DRAPE) ×1 IMPLANT
CANISTER OMNI JUG 16 LITER (MISCELLANEOUS) ×1 IMPLANT
CANISTER SUCTION 2500CC (MISCELLANEOUS) ×2 IMPLANT
CATH INTERMIT  6FR 70CM (CATHETERS) ×1 IMPLANT
CHLORAPREP W/TINT 26ML (MISCELLANEOUS) ×2 IMPLANT
CLIP LIGATING HEM O LOK PURPLE (MISCELLANEOUS) ×1 IMPLANT
CLIP LIGATING HEMO O LOK GREEN (MISCELLANEOUS) ×1 IMPLANT
CLOTH BEACON ORANGE TIMEOUT ST (SAFETY) ×2 IMPLANT
CORD HIGH FREQUENCY UNIPOLAR (ELECTROSURGICAL) ×2 IMPLANT
CORDS BIPOLAR (ELECTRODE) ×2 IMPLANT
COVER TIP SHEARS 8 DVNC (MISCELLANEOUS) ×1 IMPLANT
COVER TIP SHEARS 8MM DA VINCI (MISCELLANEOUS) ×1
DECANTER SPIKE VIAL GLASS SM (MISCELLANEOUS) ×1 IMPLANT
DERMABOND ADVANCED (GAUZE/BANDAGES/DRESSINGS) ×1
DERMABOND ADVANCED .7 DNX12 (GAUZE/BANDAGES/DRESSINGS) IMPLANT
DRAIN CHANNEL 15F RND FF 3/16 (WOUND CARE) ×2 IMPLANT
DRAPE INCISE IOBAN 66X45 STRL (DRAPES) ×2 IMPLANT
DRAPE LAPAROSCOPIC ABDOMINAL (DRAPES) ×2 IMPLANT
DRAPE LG THREE QUARTER DISP (DRAPES) ×2 IMPLANT
DRAPE TABLE BACK 44X90 PK DISP (DRAPES) ×2 IMPLANT
DRAPE UTILITY XL STRL (DRAPES) ×2 IMPLANT
DRAPE WARM FLUID 44X44 (DRAPE) ×2 IMPLANT
ELECT REM PT RETURN 9FT ADLT (ELECTROSURGICAL) ×2
ELECTRODE REM PT RTRN 9FT ADLT (ELECTROSURGICAL) ×1 IMPLANT
EVACUATOR SILICONE 100CC (DRAIN) ×2 IMPLANT
GAUZE VASELINE 3X9 (GAUZE/BANDAGES/DRESSINGS) IMPLANT
GLOVE BIOGEL M STRL SZ7.5 (GLOVE) ×6 IMPLANT
GOWN STRL NON-REIN LRG LVL3 (GOWN DISPOSABLE) ×14 IMPLANT
GUIDEWIRE STR DUAL SENSOR (WIRE) ×2 IMPLANT
KIT ACCESSORY DA VINCI DISP (KITS) ×1
KIT ACCESSORY DVNC DISP (KITS) ×1 IMPLANT
KIT BASIN OR (CUSTOM PROCEDURE TRAY) ×2 IMPLANT
LUBRICANT JELLY ST 5GR 8946 (MISCELLANEOUS) ×4 IMPLANT
MARKER SKIN DUAL TIP RULER LAB (MISCELLANEOUS) ×2 IMPLANT
NS IRRIG 1000ML POUR BTL (IV SOLUTION) ×1 IMPLANT
PACK CYSTO (CUSTOM PROCEDURE TRAY) ×2 IMPLANT
PENCIL BUTTON HOLSTER BLD 10FT (ELECTRODE) ×2 IMPLANT
POSITIONER SURGICAL ARM (MISCELLANEOUS) ×4 IMPLANT
SET TUBE IRRIG SUCTION NO TIP (IRRIGATION / IRRIGATOR) IMPLANT
SOLUTION ANTI FOG 6CC (MISCELLANEOUS) ×2 IMPLANT
SOLUTION ELECTROLUBE (MISCELLANEOUS) ×2 IMPLANT
SPONGE LAP 18X18 X RAY DECT (DISPOSABLE) ×1 IMPLANT
STAPLER VISISTAT 35W (STAPLE) ×2 IMPLANT
SUT ETHILON 3 0 PS 1 (SUTURE) ×2 IMPLANT
SUT MNCRL AB 4-0 PS2 18 (SUTURE) ×4 IMPLANT
SUT VIC AB 0 CT1 27 (SUTURE)
SUT VIC AB 0 CT1 27XBRD ANTBC (SUTURE) ×3 IMPLANT
SUT VIC AB 0 UR5 27 (SUTURE) ×2 IMPLANT
SUT VIC AB 4-0 RB1 27 (SUTURE) ×12
SUT VIC AB 4-0 RB1 27XBRD (SUTURE) ×6 IMPLANT
SUT VICRYL 0 UR6 27IN ABS (SUTURE) ×4 IMPLANT
SYR BULB IRRIGATION 50ML (SYRINGE) IMPLANT
TOWEL OR NON WOVEN STRL DISP B (DISPOSABLE) ×2 IMPLANT
TRAY FOLEY CATH 14FRSI W/METER (CATHETERS) ×2 IMPLANT
TRAY LAP CHOLE (CUSTOM PROCEDURE TRAY) ×2 IMPLANT
TROCAR ENDOPATH XCEL 12X100 BL (ENDOMECHANICALS) ×2 IMPLANT
TROCAR XCEL 12X100 BLDLESS (ENDOMECHANICALS) ×2 IMPLANT
TUBING CONNECTING 10 (TUBING) ×1 IMPLANT
TUBING INSUFFLATION 10FT LAP (TUBING) ×2 IMPLANT
WATER STERILE IRR 1500ML POUR (IV SOLUTION) ×3 IMPLANT

## 2013-03-07 NOTE — Anesthesia Preprocedure Evaluation (Addendum)
Anesthesia Evaluation  Patient identified by MRN, date of birth, ID band Patient awake    Reviewed: Allergy & Precautions, H&P , NPO status , Patient's Chart, lab work & pertinent test results  History of Anesthesia Complications (+) MALIGNANT HYPERTHERMIA  Airway Mallampati: III TM Distance: >3 FB Neck ROM: Full    Dental no notable dental hx.    Pulmonary sleep apnea ,  breath sounds clear to auscultation  Pulmonary exam normal       Cardiovascular hypertension, Pt. on medications Rhythm:Regular Rate:Normal     Neuro/Psych negative neurological ROS  negative psych ROS   GI/Hepatic Neg liver ROS, GERD-  Medicated,  Endo/Other  Hypothyroidism Morbid obesity  Renal/GU negative Renal ROS  negative genitourinary   Musculoskeletal negative musculoskeletal ROS (+)   Abdominal   Peds negative pediatric ROS (+)  Hematology negative hematology ROS (+)   Anesthesia Other Findings   Reproductive/Obstetrics negative OB ROS                         Anesthesia Physical Anesthesia Plan  ASA: III  Anesthesia Plan: General   Post-op Pain Management:    Induction: Intravenous  Airway Management Planned: Oral ETT  Additional Equipment:   Intra-op Plan:   Post-operative Plan: Extubation in OR  Informed Consent: I have reviewed the patients History and Physical, chart, labs and discussed the procedure including the risks, benefits and alternatives for the proposed anesthesia with the patient or authorized representative who has indicated his/her understanding and acceptance.   Dental advisory given  Plan Discussed with: CRNA and Surgeon  Anesthesia Plan Comments:         Anesthesia Quick Evaluation

## 2013-03-07 NOTE — Progress Notes (Signed)
Patient UOP 40ml in PACU and 10-65ml in Foley Bag since arrival to floor.  Marchelle Folks, [place] on floor, aware of UOP.  Will monitor output closely and notify Dr. Laverle Patter of output this evening during rounds.

## 2013-03-07 NOTE — Anesthesia Postprocedure Evaluation (Signed)
  Anesthesia Post-op Note  Patient: [name]  Procedure(s) Performed: Procedure(s) (LRB): ROBOTIC ASSISTED PYELOPLASTY (Left)  Patient Location: PACU  Anesthesia Type: General  Level of Consciousness: awake and alert   Airway and Oxygen Therapy: Patient Spontanous Breathing  Post-op Pain: mild  Post-op Assessment: Post-op Vital signs reviewed, Patient's Cardiovascular Status Stable, Respiratory Function Stable, Patent Airway and No signs of Nausea or vomiting  Last Vitals:  Filed Vitals:   03/07/13 1215  BP: 145/64  Pulse: 73  Temp: 36.4 C  Resp: 13    Post-op Vital Signs: stable   Complications: No apparent anesthesia complications

## 2013-03-07 NOTE — Transfer of Care (Signed)
Immediate Anesthesia Transfer of Care Note  Patient: [name]  Procedure(s) Performed: Procedure(s): ROBOTIC ASSISTED PYELOPLASTY (Left)  Patient Location: PACU  Anesthesia Type:General  Level of Consciousness: awake, alert , oriented and patient cooperative  Airway & Oxygen Therapy: Patient Spontanous Breathing and Patient connected to face mask oxygen  Post-op Assessment: Report given to PACU RN, Post -op Vital signs reviewed and stable and Patient moving all extremities  Post vital signs: Reviewed and stable  Complications: No apparent anesthesia complications

## 2013-03-07 NOTE — Progress Notes (Signed)
Patient ID: [name], female   DOB: 1936/03/24, 77 y.o.   MRN: [id number] Post-op note  Subjective: The patient is doing well.  Some left sided back pain/soreness. No N/V.    Objective: Vital signs in last 24 hours: Temp:  [97.1 F (36.2 C)-97.7 F (36.5 C)] 97.4 F (36.3 C) (06/23 1320) Pulse Rate:  [69-76] 76 (06/23 1320) Resp:  [7-18] 18 (06/23 1320) BP: (126-170)/(55-83) 161/65 mmHg (06/23 1320) SpO2:  [93 %-97 %] 96 % (06/23 1320) Weight:  [91.7 kg (202 lb 2.6 oz)] 91.7 kg (202 lb 2.6 oz) (06/23 1320)  Intake/Output from previous day:   Intake/Output this shift: Total I/O In: 2150 [I.V.:2150] Out: 450 [Urine:220; Drains:80; Blood:150]  Physical Exam:  General: Alert and oriented. Abdomen: Soft, Nondistended. Incisions: Clean and dry.  Lab Results:  Recent Labs  03/07/13 1139  HGB 12.4  HCT 36.8    Assessment/Plan: POD#0   1) Continue to monitor 2) Monitor UO.  May need small fluid bolus 3) DVT prophy, pain control, IS, Amb     LOS: 0 days   YARBROUGH,Imogine Carvell G. 03/07/2013, 3:28 PM

## 2013-03-07 NOTE — Preoperative (Signed)
Beta Blockers   Reason not to administer Beta Blockers:Metlprolol taken at 0430 03-07-13

## 2013-03-07 NOTE — Op Note (Signed)
Preoperative diagnosis: Left ureteropelvic junction obstruction, functional solitary left kidney  Postoperative diagnosis: Left ureteropelvic junction obstruction, functional solitary left kidney  Procedure:  1. Laparoscopic adhesiolysis 2. Left robotic-assisted laparoscopic dismembered pyeloplasty  Surgeon: Moody Bruins. M.D.  Assistant(s): Pecola Leisure, PA-C  Anesthesia: General  Complications: None  EBL: 150 mL  IVF:  1700 mL crystalloid  Specimens: 1. Left ureteropelvic junction  Disposition of specimens: Pathology  Intraoperative findings: There was extensive scarring around the UPJ consistent with her prior procedure. There was found to be crossing vessels with the ureter posterior and wrapped around the renal artery.  Drains:  1. # 15 Blake perinephric drain 2. 16 Fr Foley catheter  Indication: [name] is a 77 y.o. patient with a left ureteropelvic junction obstruction s/p a pyeloplasty in the 1960s. She has a solitary functional left kidney and developed recurrent symptoms of flank pain, hydronephrosis, and worsening renal function which improved after ureteral stent placement..  After a thorough review of the management options for their ureteropelvic junction obstruction, they elected to proceed with surgical treatment and the above procedure.  We have discussed the potential benefits and risks of the procedure, side effects of the proposed treatment, the likelihood of the patient achieving the goals of the procedure, and any potential problems that might occur during the procedure or recuperation. Informed consent has been obtained.  Description of procedure:  The patient was taken to the operating room and a general anesthetic was administered. The patient was given preoperative antibiotics, placed in the dorsal lithotomy position, and prepped and draped in the usual sterile fashion. Next a preoperative timeout was performed.  The patient was  positioned in the left modified flank position and prepped and draped in the usual sterile fashion. A site was selected on the left side of the umbilicus for placement of the camera port. This was placed using a standard open Hassan technique which allowed entry into the peritoneal cavity under direct vision and without difficulty. A 12 mm port was placed and a pneumoperitoneum established. The camera was then used to inspect the abdomen and there was no evidence of any intra-abdominal injuries or other abnormalities aside from extensive intraperitoneal adhesions between the omentum and abdominal wall. The remaining abdominal ports were then placed. 8 mm robotic ports were placed in the ipsilateral upper quadrant, lower quadrant, and far lateral abdominal wall. A 12 mm port was placed in the upper midline for laparoscopic assistance. All ports were placed under direct vision without difficulty. Laparoscopic scissors were then used to take down the adhesions.  Once enough of the adhesions were taken down to allow further dissection to proceed robotically, the surgical cart was docked. Additional adhesions were then taken down with the robotic scissors.  A total of 40 minutes was spent performing adhesiolysis which was approximately 30% of the total operative time.  Utilizing the cautery scissors, the white line of Toldt was incised allowing the plane between the mesocolon and the anterior layer of Gerota's fascia to be developed and the kidney exposed.  The ureter and gonadal vein were identified inferiorly and the ureter was lifted anteriorly off the psoas muscle.  Dissection proceeded superiorly and the ureteropelvic junction was isolated from the surrounding structures with a combination of sharp and blunt dissection.  This required an extensive dissection due to the scarring around the ureter, renal pelvis, and renal vessels from her prior procedure.  There was noted to be a lower pole crossing renal vessel  with  the artery lying anterior to the ureter.  The ureter then crossed laterally under the artery to its insertion into the renal pelvis which was at an almost 90 degree angle to the ureter. The renal pelvis was carefully and tediously dissected away from the crossing vessels including a more superiorly located renal vein which was anterior to the pelvis. Once the pelvis and ureter were well mobilized, the ureteropelvic junction was divided allowing exposure of the indwelling stent and the ureteropelvic junction was excised and removed. The ureter and renal pelvis were then transposed anterior to the crossing lower pole renal vessel.  The ureter and renal pelvis were then examined.  The renal pelvis and ureter were then spatulated appropriately.  4-0 vicryl sutures were then used to reapproximate the renal pelvis and ureter with interrupted and/or running sutures as indicated.  The anastomosis was performed in a tension-free, watertight fashion with the ureter reconnected to the renal pelvis in a position to provide dependent drainage of the renal collecting system. The ureteral stent was repositioned appropriately prior to securing the final sutures of the ureteropelvic anastomosis.  A # 15 Blake perinephric drain was then brought through the lateral lower abdominal port site and positioned appropriately.  It was secured to the skin with a nylon suture.  The 12 mm port sites were then closed with 0-vicryl sutures placed laparoscopically with the laparoscopic suture passer. All remaining ports were removed under direct vision after hemostasis was confirmed with the pneumoperiotneum let down. All port sites were injected with 0.25% bupivicaine and reapproximated at the skin level with 4-0 monocryl subcuticular sutures. Dermabond was applied to the skin.  The patient appeared to tolerate the procedure well and without complications.  The patient was able to be extubated and transferred to the recovery unit in  satisfactory condition.  Moody Bruins MD

## 2013-03-08 ENCOUNTER — Encounter (HOSPITAL_COMMUNITY): Payer: Self-pay | Admitting: Urology

## 2013-03-08 LAB — HEMOGLOBIN AND HEMATOCRIT, BLOOD
HCT: 30.7 % — ABNORMAL LOW (ref 36.0–46.0)
Hemoglobin: 10.1 g/dL — ABNORMAL LOW (ref 12.0–15.0)

## 2013-03-08 LAB — BASIC METABOLIC PANEL
CO2: 25 mEq/L (ref 19–32)
Chloride: 100 mEq/L (ref 96–112)
Creatinine, Ser: 1.03 mg/dL (ref 0.50–1.10)
Sodium: 130 mEq/L — ABNORMAL LOW (ref 135–145)

## 2013-03-08 MED ORDER — DEXTROSE-NACL 5-0.9 % IV SOLN
INTRAVENOUS | Status: DC
  Administered 2013-03-08: 125 mL/h via INTRAVENOUS

## 2013-03-08 MED ORDER — BISACODYL 10 MG RE SUPP
10.0000 mg | Freq: Once | RECTAL | Status: AC
  Administered 2013-03-08: 10 mg via RECTAL
  Filled 2013-03-08: qty 1

## 2013-03-08 MED ORDER — HYDROCODONE-ACETAMINOPHEN 5-325 MG PO TABS
1.0000 | ORAL_TABLET | Freq: Four times a day (QID) | ORAL | Status: DC | PRN
  Administered 2013-03-08 – 2013-03-09 (×5): 2 via ORAL
  Filled 2013-03-08 (×5): qty 2

## 2013-03-08 MED FILL — Bupivacaine Inj 0.25% w/ Epinephrine 1:200000 (PF): INTRAMUSCULAR | Qty: 30 | Status: AC

## 2013-03-08 NOTE — Progress Notes (Signed)
Patient ID: [name], female   DOB: 1935-10-14, 77 y.o.   MRN: [id number]  1 Day Post-Op Subjective: Pt doing well.  No nausea or vomiting.  No flatus.  She had low UOP yesterday which is somewhat improved but still low overall since surgery.  Objective: Vital signs in last 24 hours: Temp:  [97.3 F (36.3 C)-98 F (36.7 C)] 98 F (36.7 C) (06/24 0501) Pulse Rate:  [65-76] 66 (06/24 0501) Resp:  [7-18] 18 (06/24 0501) BP: (105-170)/(55-83) 128/55 mmHg (06/24 0501) SpO2:  [92 %-97 %] 95 % (06/24 0501) Weight:  [91.7 kg (202 lb 2.6 oz)] 91.7 kg (202 lb 2.6 oz) (06/23 1320)  Intake/Output from previous day: 06/23 0701 - 06/24 0700 In: 4555.8 [P.O.:360; I.V.:3395.8; IV Piggyback:800] Out: 935 [Urine:645; Drains:140; Blood:150] Intake/Output this shift:    Physical Exam:  General: Alert and oriented CV: RRR Lungs: Clear Abdomen: Soft, ND, Positive BS Incisions: C/D/I GU: Urine is grossly clear Ext: NT, No erythema  Lab Results:  Recent Labs  03/07/13 1139 03/08/13 0432  HGB 12.4 10.1*  HCT 36.8 30.7*   BMET  Recent Labs  03/07/13 1139 03/08/13 0432  NA 136 130*  K 4.6 4.7  CL 102 100  CO2 27 25  GLUCOSE 131* 121*  BUN 10 11  CREATININE 0.91 1.03  CALCIUM 9.3 8.4     Studies/Results: Dg Abd 1 View  03/07/2013   *RADIOLOGY REPORT*  Clinical Data: Status post stent placement.  ABDOMEN - 1 VIEW  Comparison: CT abdomen pelvis 01/10/2013  Findings: In the a left double-J ureteral stent is in place.  An additional drainage tube projects over the left kidney.  The patient is status post right total hip arthroplasty.  Bowel gas pattern is unremarkable.  IMPRESSION: A double-J ureteral stent is in place.  Both loops are formed.  A second drainage catheter enters from the left lateral flank and projects over the left kidney.  The tip is outside the expected course of the ureter.   Original Report Authenticated By: Marin Roberts, M.D.    Assessment/Plan: POD #1  s/p left robotic pyeloplasty - Advance diet, Dulcolax, IS, Ambulate - Will leave catheter this morning to continue to closely monitor UOP - Renal function remains stable but considering solitary functional kidney, will continue to monitor in the hospital and will recheck renal function tomorrow - Anticipate d/c tomorrow if UOP improves and patient continues to recover as expected - Continue to monitor Hgb and drain output   LOS: 1 day   Rishan Oyama,LES 03/08/2013, 7:06 AM

## 2013-03-08 NOTE — Progress Notes (Signed)
Pt placed self on CPAP, machine is working properly.  RT to monitor and assess as needed.

## 2013-03-08 NOTE — Progress Notes (Signed)
Spoke with pt in regards to nighttime CPAP, pt has brought in nasal mask/tubing from home. ordered per protocol for documented hx sleep apnea. Placed pt on auto titration. Filled humidification chamber to appropriate level. Pt tol well at this time. RT will assist as needed.

## 2013-03-08 NOTE — Progress Notes (Signed)
Patient ID: [name], female   DOB: [birth year], 77 y.o.   MRN: [id number]  UOP improved.  Urine remains mostly clear.  Will remove catheter.

## 2013-03-08 NOTE — Progress Notes (Signed)
[name], Nurse Extender on call to inform her of the decreased urine out put after the 500 cc bolus given earlier in the shift.... Additional 500cc bolus given and urine output has increased currently 400 cc noted. SRPickett, Market researcher.

## 2013-03-09 LAB — BASIC METABOLIC PANEL
Calcium: 8.8 mg/dL (ref 8.4–10.5)
GFR calc Af Amer: 74 mL/min — ABNORMAL LOW (ref >90)
GFR calc non Af Amer: 64 mL/min — ABNORMAL LOW (ref >90)
Sodium: 130 mEq/L — ABNORMAL LOW (ref 135–145)

## 2013-03-09 LAB — HEMOGLOBIN AND HEMATOCRIT, BLOOD: HCT: 31.7 % — ABNORMAL LOW (ref 36.0–46.0)

## 2013-03-09 LAB — CREATININE, FLUID (PLEURAL, PERITONEAL, JP DRAINAGE): Creat, Fluid: 0.8 mg/dL

## 2013-03-09 MED ORDER — DSS 100 MG PO CAPS: 100.0000 mg | ORAL_CAPSULE | Freq: Two times a day (BID) | ORAL | Status: DC

## 2013-03-09 MED ORDER — HYDROCODONE-ACETAMINOPHEN 5-325 MG PO TABS: 1.0000 | ORAL_TABLET | Freq: Four times a day (QID) | ORAL | Status: DC | PRN

## 2013-03-09 NOTE — Progress Notes (Signed)
Patient ID: [name], female   DOB: Apr 29, 1936, 77 y.o.   MRN: [id number]  2 Days Post-Op Subjective: Pt had nausea and one episode of vomiting. She also had an episode of diarrhea. Incisional pain continues but is controlled with po pain medication.  Objective: Vital signs in last 24 hours: Temp:  [98 F (36.7 C)-98.4 F (36.9 C)] 98 F (36.7 C) (06/25 0456) Pulse Rate:  [64-70] 66 (06/25 0456) Resp:  [18] 18 (06/25 0456) BP: (126-165)/(60-68) 165/60 mmHg (06/25 0456) SpO2:  [93 %-97 %] 97 % (06/25 0456)  Intake/Output from previous day: 06/24 0701 - 06/25 0700 In: 2385.4 [P.O.:540; I.V.:1835.4] Out: 2590 [Urine:2395; Drains:195] Intake/Output this shift:    Physical Exam:  General: Alert and oriented CV: RRR Lungs: Clear Abdomen: Soft, ND, Positive BS  Incisions: C/D/I Ext: NT, No erythema  Lab Results:  Recent Labs  03/07/13 1139 03/08/13 0432 03/09/13 0435  HGB 12.4 10.1* 10.4*  HCT 36.8 30.7* 31.7*   BMET  Recent Labs  03/08/13 0432 03/09/13 0435  NA 130* 130*  K 4.7 4.6  CL 100 100  CO2 25 25  GLUCOSE 121* 118*  BUN 11 8  CREATININE 1.03 0.87  CALCIUM 8.4 8.8      Assessment/Plan: - SL IVF - Check drain Cr - Regular diet - Will reassess for discharge later today   LOS: 2 days   Partick Musselman,LES 03/09/2013, 7:16 AM

## 2013-03-10 NOTE — Discharge Summary (Signed)
Date of admission: 03/07/2013  Date of discharge: 03/09/13  Admission diagnosis: Left UPJ obstruction  Discharge diagnosis: same  Secondary diagnoses: OSA with CPAP, anxiety, GERD, HTN, hyperthyroidism History and Physical: For full details, please see admission history and physical. Briefly, [name] is a 77 y.o. year old patient with a reported history of bilateral UPJ obstruction s/p bilateral open flank repair in her 25s. She was asymptomatic for 50 years and eventually developed a non-functional right kidney with estimated 5% relative function on renogram imaging in 2011. She was seen by Dr. Isabel Caprice at that time for unclear reasons and was asymptomatic with normal global renal function despite left hydronephrosis. She was recommended to continue follow up but did not follow up as recommended and eventually presented in April 2014 with complaints of intermittent moderate left flank pain controlled with ibuprofen. A CT stone study at that time demonstrated progressive left hydronephrosis compared to prior imaging studies. No ureteral dilation was noted and there is suggestion of a possible small accessory renal artery. Her right kidney was severely atrophic. Her Scr at that time had increased to 1.9 from a baseline of 0.7 in 2011. She was taken to the OR by Dr. Isabel Caprice and retrograde pyelography revealed a normal appearing ureter with a tortuous proximal ureter and hydronephrosis consistent with UPJ obstruction. No intraluminal filling defect were identified. He placed a ureteral stent with complete relief of her pain and improvement of her renal function with a nadir Scr of 1.3.  Her comorbidities include a history of sleep apnea, hypertension, hypercholesterolemia, and gastroesophageal reflux, and hypothyroidism. She did have a history of atypical chest pain a few years ago and underwent an evaluation by Dr. Garnette Scheuermann which included a cardiac catheterization and was reportedly unremarkable. She  does describe progressive dyspnea with exertion over the past few years. She states that it is difficult for her to climb a flight of stairs without becoming significantly short of breath. She has not had any chest pain but has described intermittent bilateral lower extremity over the past few years.   Hospital Course: Pt was admitted and taken to the OR on 03/07/13 for left robotic assisted laparoscopic dismembered pyeloplasty and extensive adhesiolysis.  Pt tolerated procedure well and was hemodynamically stable immediately post op.  She was extubated without complication and woke up from anesthesia neurologically intact.  Her post op course progressed as expected.  She was able to ambulate and tolerate a regular diet.  Her vitals remained stable.  Her JP drainage was checked for Cr and this was found to be consistent with serum therefore the drain was removed on POD 2.  She did have one episode of emesis and diarrhea on the evening of POD 1 but this resolved.  Her foley was removed on POD 1 and she was able to void without difficulty.  On POD 2 she was doing well and felt stable for d/c home.     Laboratory values:  Recent Labs  03/07/13 1139 03/08/13 0432 03/09/13 0435  HGB 12.4 10.1* 10.4*  HCT 36.8 30.7* 31.7*    Recent Labs  03/08/13 0432 03/09/13 0435  CREATININE 1.03 0.87    Disposition: Home  Discharge instruction: The patient was instructed to be ambulatory but told to refrain from heavy lifting, strenuous activity, or driving.   Discharge medications:    Medication List    STOP taking these medications       levofloxacin 750 MG tablet  Commonly known as:  LEVAQUIN  Vitamin D 2000 UNITS tablet      TAKE these medications       ALPRAZolam 0.5 MG tablet  Commonly known as:  XANAX  Take 0.5 mg by mouth 3 (three) times daily as needed for sleep.     citalopram 20 MG tablet  Commonly known as:  CELEXA  Take 20 mg by mouth every morning.     DSS 100 MG Caps   Take 100 mg by mouth 2 (two) times daily.     furosemide 40 MG tablet  Commonly known as:  LASIX  Take 40 mg by mouth daily as needed (for fluid). Take with potassium     HYDROcodone-acetaminophen 5-325 MG per tablet  Commonly known as:  NORCO/VICODIN  Take 1-2 tablets by mouth every 6 (six) hours as needed for pain.     levothyroxine 150 MCG tablet  Commonly known as:  SYNTHROID, LEVOTHROID  Take 150 mcg by mouth daily before breakfast.     meloxicam 15 MG tablet  Commonly known as:  MOBIC  Take 15 mg by mouth daily as needed.     metoprolol succinate 50 MG 24 hr tablet  Commonly known as:  TOPROL-XL  Take 50 mg by mouth every morning. Take with or immediately following a meal.     olmesartan-hydrochlorothiazide 40-12.5 MG per tablet  Commonly known as:  BENICAR HCT  Take 1 tablet by mouth every morning.     omeprazole 20 MG capsule  Commonly known as:  PRILOSEC  Take 20 mg by mouth every evening.     potassium chloride SA 20 MEQ tablet  Commonly known as:  K-DUR,KLOR-CON  Take 20 mEq by mouth daily as needed (for fliud). Take with lasix     pramipexole 1 MG tablet  Commonly known as:  MIRAPEX  Take 2 mg by mouth every evening.     rOPINIRole 1 MG tablet  Commonly known as:  REQUIP  Take 2 mg by mouth at bedtime as needed.     sodium chloride 0.65 % nasal spray  Commonly known as:  OCEAN  Place 1 spray into the nose as needed for congestion.     zolpidem 10 MG tablet  Commonly known as:  AMBIEN  Take 10 mg by mouth at bedtime as needed for sleep.        Followup:      Follow-up Information   Follow up with Crecencio Mc, MD. (call office for appt after discharge home )    Contact information:   [address] [place] [address] [phone]

## 2013-10-14 ENCOUNTER — Other Ambulatory Visit (HOSPITAL_COMMUNITY): Payer: Self-pay | Admitting: Urology

## 2013-10-14 DIAGNOSIS — N135 Crossing vessel and stricture of ureter without hydronephrosis: Secondary | ICD-10-CM

## 2013-10-27 ENCOUNTER — Ambulatory Visit (HOSPITAL_COMMUNITY)
Admission: RE | Admit: 2013-10-27 | Discharge: 2013-10-27 | Disposition: A | Discharge status: Home / Self Care | Payer: Medicare Other | Source: Ambulatory Visit | Attending: Urology | Admitting: Urology

## 2013-10-27 DIAGNOSIS — N135 Crossing vessel and stricture of ureter without hydronephrosis: Secondary | ICD-10-CM | POA: Insufficient documentation

## 2013-10-27 MED ORDER — FUROSEMIDE 10 MG/ML IJ SOLN
60.0000 mg | Freq: Once | INTRAMUSCULAR | Status: AC
  Administered 2013-10-27: 46 mg via INTRAVENOUS
  Filled 2013-10-27: qty 6

## 2013-10-27 MED ORDER — TECHNETIUM TC 99M MERTIATIDE
15.8000 | Freq: Once | INTRAVENOUS | Status: AC | PRN
  Administered 2013-10-27: 15.8 via INTRAVENOUS

## 2014-05-24 ENCOUNTER — Encounter (HOSPITAL_COMMUNITY): Payer: Self-pay | Admitting: *Deleted

## 2014-05-24 ENCOUNTER — Encounter: Payer: Self-pay | Admitting: Cardiology

## 2014-05-24 ENCOUNTER — Ambulatory Visit (INDEPENDENT_AMBULATORY_CARE_PROVIDER_SITE_OTHER): Payer: Medicare Other | Admitting: Cardiology

## 2014-05-24 VITALS — BP 100/60 | HR 66 | Ht 63.0 in | Wt 203.1 lb

## 2014-05-24 DIAGNOSIS — R0609 Other forms of dyspnea: Secondary | ICD-10-CM

## 2014-05-24 DIAGNOSIS — R609 Edema, unspecified: Secondary | ICD-10-CM

## 2014-05-24 DIAGNOSIS — R0989 Other specified symptoms and signs involving the circulatory and respiratory systems: Secondary | ICD-10-CM

## 2014-05-24 DIAGNOSIS — R600 Localized edema: Secondary | ICD-10-CM

## 2014-05-24 DIAGNOSIS — R079 Chest pain, unspecified: Secondary | ICD-10-CM

## 2014-05-24 DIAGNOSIS — R0602 Shortness of breath: Secondary | ICD-10-CM

## 2014-05-24 NOTE — Patient Instructions (Signed)
Your physician has requested that you have a lexiscan myoview. For further information please visit [email or web address] Please follow instruction sheet, as given.   Your physician recommends that you keep scheduled  follow-up appointment  With Dr. Katrinka Blazing.

## 2014-05-24 NOTE — Progress Notes (Signed)
05/28/2014   PCP: Pearla Dubonnet, MD   Chief Complaint  Patient presents with  . Shortness of Breath    on excertion, leg edema    Primary Cardiologist: Dr. Katrinka Blazing   HPI:  78 year old female pt of Dr. Katrinka Blazing referred by Dr. Kevan Ny to rule out ischemic cause of her edema and DOE.   She also complains of episodic chest pain and along with DOE.   She has hx of prior caths, the last one 2009 with patent coronary arteries.  Last echo 02/28/13 done for pre-op clearance with EF 55-60%, grade 2 diastolic dysfunction.  She complains of lower ext edema as well and is on lasix 80 mg daily.  She has OSA and uses her CPAP.  Allergies  Allergen Reactions  . Sulfa Antibiotics Shortness Of Breath  . Penicillins Rash    Current Outpatient Prescriptions  Medication Sig Dispense Refill  . ALPRAZolam (XANAX) 0.5 MG tablet Take 0.5 mg by mouth as needed for sleep.       Marland Kitchen buPROPion (WELLBUTRIN) 100 MG tablet Take 100 mg by mouth daily.      . cholecalciferol (VITAMIN D) 1000 UNITS tablet Take 2,000 Units by mouth daily.      . citalopram (CELEXA) 20 MG tablet Take 20 mg by mouth every morning.       . furosemide (LASIX) 40 MG tablet Take 80 mg by mouth daily. Take with potassium      . levothyroxine (SYNTHROID, LEVOTHROID) 150 MCG tablet Take 150 mcg by mouth daily before breakfast.      . meloxicam (MOBIC) 15 MG tablet Take 15 mg by mouth daily as needed.       . metoprolol succinate (TOPROL-XL) 50 MG 24 hr tablet Take 50 mg by mouth every morning. Take with or immediately following a meal.      . olmesartan-hydrochlorothiazide (BENICAR HCT) 40-12.5 MG per tablet Take 1 tablet by mouth every morning.      Marland Kitchen omeprazole (PRILOSEC) 20 MG capsule Take 20 mg by mouth every evening.       . potassium chloride SA (K-DUR,KLOR-CON) 20 MEQ tablet Take 20 mEq by mouth 2 (two) times daily. Take with lasix      . pramipexole (MIRAPEX) 1 MG tablet Take 2 mg by mouth every evening.       . sodium  chloride (OCEAN) 0.65 % nasal spray Place 1 spray into the nose as needed for congestion.      Marland Kitchen zolpidem (AMBIEN) 10 MG tablet Take 10 mg by mouth at bedtime as needed for sleep.      . metroNIDAZOLE (METROGEL) 0.75 % gel Apply 1 application topically as needed.       No current facility-administered medications for this visit.    Past Medical History  Diagnosis Date  . OSA on CPAP   . Hypertension   . Hypothyroidism   . Depression   . Anxiety   . GERD (gastroesophageal reflux disease)   . Arthritis   . Non-functioning kidney RIGHT (CONGENITAL)  . RLS (restless legs syndrome)   . Nocturia   . Hydronephrosis, left   . Tear of right rotator cuff   . Congenital abnormality ureter     BILATERAL  . Complication of anesthesia     SLOW TO WEAR OFF - "FOR A WEEK"  . Shortness of breath     currently having problem with dyspnea followed by Dr. Verdis Prime unknown  cause     Past Surgical History  Procedure Laterality Date  . Cardiovascular stress test  09-24-2005    NORMAL CARDIOLITE/ NO ISCHEMIA/ EF 80%/ SMALL LV CAVITY WITH SUPRANORMAL CONTRACTILITY  . Cardiac catheterization  12-30-2007  DR Verdis Prime    NORMAL CORONARY ARTERIES/ NORMAL LVF  . Transthoracic echocardiogram  12-31-2007    NORMAL LVSF/ EF 65%/  WALL MOTION NORMAL  . Total hip arthroplasty Right 07-08-2011  . Tonsillectomy and adenoidectomy    . Bilateral ureteral stricture repair  1939    CONGENITAL DEFECT  . Vaginal hysterectomy  1978    W/ BILATERAL SALPINGOOPHORECTOMY  . Tubal ligation  1964    AND APPENDECOTMY  . Orif bilateral wrist fx's--  distal radius  01-05-2008  . Removal dvr plate left hand  08-15-2008    RETAINED HARDWARE RIGHT WRIST  . Abdominoplasty  2002  . Cystoscopy w/ ureteral stent placement Left 01/12/2013    Procedure: CYSTOSCOPY WITH RETROGRADE PYELOGRAM/URETERAL STENT PLACEMENT;  Surgeon: Valetta Fuller, MD;  Location: [facility];  Service: Urology;  Laterality: Left;    . Robot assisted pyeloplasty Left 03/07/2013    Procedure: ROBOTIC ASSISTED PYELOPLASTY;  Surgeon: Crecencio Mc, MD;  Location: [facility];  Service: Urology;  Laterality: Left;    JXB:JYNWGNF:AO colds or fevers,  weight is down 4 pounds from her appt with Dr. Kevan Ny.  Skin:no rashes or ulcers HEENT:no blurred vision, no congestion CV:see HPI PUL:see HPI GI:no diarrhea constipation or melena, no indigestion GU:no hematuria, no dysuria MS:no joint pain, no claudication Neuro:no syncope, no lightheadedness Endo:no diabetes, no thyroid disease  Wt Readings from Last 3 Encounters:  05/24/14 203 lb 1.6 oz (92.126 kg)  03/07/13 202 lb 2.6 oz (91.7 kg)  03/07/13 202 lb 2.6 oz (91.7 kg)    PHYSICAL EXAM BP 100/60  Pulse 66  Ht  (1.6 m)  Wt 203 lb 1.6 oz (92.126 kg)  BMI 35.99 kg/m2 General:Pleasant affect, NAD Skin:Warm and dry, brisk capillary refill HEENT:normocephalic, sclera clear, mucus membranes moist Neck:supple, no JVD, no bruits  Heart:S1S2 RRR without murmur, gallup, rub or click Lungs:clear without rales, rhonchi, or wheezes ZHY:QMVH, non tender, + BS, do not palpate liver spleen or masses Ext:no lower ext edema, 2+ pedal pulses, 2+ radial pulses Neuro:alert and oriented, MAE, follows commands, + facial symmetry EKG:SR rate 66 no acute changes  ASSESSMENT AND PLAN Chest pain on exertion Plan lexiscan myoview, she cannot walk due to her SOB and back pain  DOE (dyspnea on exertion) Pt has had intermittent DOE for years but is now interfering with her activities, it may be related to her diastolic dysfunction but ischemia is also concerning, Her BP is well controlled.  She is obese with BMI of 35.9, we discussed her weight and she plans to begin to loose wt once we clear her for more exercise.     Bilateral leg edema Now on lasix 80 mg daily.   She has a follow up appt with Dr. Katrinka Blazing in October, if test is abnormal we will plan treatment prior to that visit.

## 2014-05-26 DIAGNOSIS — R079 Chest pain, unspecified: Secondary | ICD-10-CM | POA: Insufficient documentation

## 2014-05-26 DIAGNOSIS — R0609 Other forms of dyspnea: Secondary | ICD-10-CM | POA: Insufficient documentation

## 2014-05-26 NOTE — Assessment & Plan Note (Addendum)
Pt has had intermittent DOE for years but is now interfering with her activities, it may be related to her diastolic dysfunction but ischemia is also concerning, Her BP is well controlled.  She is obese with BMI of 35.9, we discussed her weight and she plans to begin to loose wt once we clear her for more exercise.

## 2014-05-26 NOTE — Assessment & Plan Note (Signed)
Plan lexiscan myoview, she cannot walk due to her SOB and back pain

## 2014-05-28 DIAGNOSIS — R600 Localized edema: Secondary | ICD-10-CM | POA: Insufficient documentation

## 2014-05-28 NOTE — Assessment & Plan Note (Signed)
Now on lasix 80 mg daily.

## 2014-05-30 IMAGING — CT CT CHEST W/O CM
2 of 4 series · 15 of 36 positions shown, 18 images · non-contrast
Comparison: Chest radiograph 07/03/2011.

CLINICAL DATA: Abnormal chest on shoulder radiograph done in the
office.  Increasing shortness of breath.

CT CHEST WITHOUT CONTRAST
TECHNIQUE: Multidetector CT imaging of the chest was performed
following the standard protocol without IV contrast.

[Series 2: chest w/o · axial · non-contrast · 0.78mm/px · z∈[-242,-16]mm · 12 of 55 slices shown, 15 images]
[im 5/55  mediastinal]
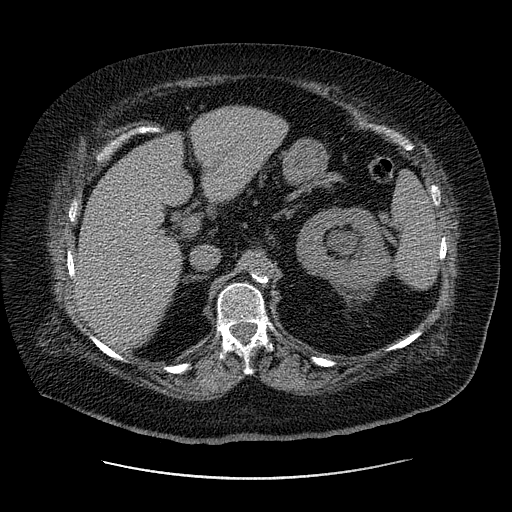
[im 5/55  lung]
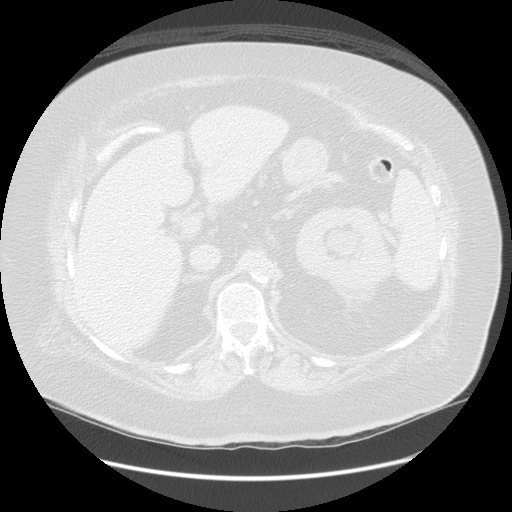
[im 9/55  lung]
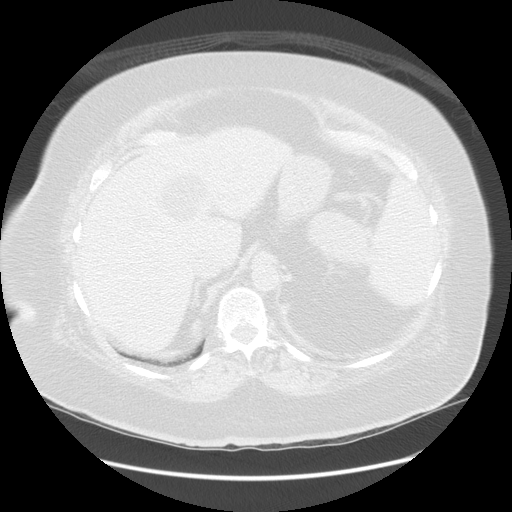
[im 13/55  lung]
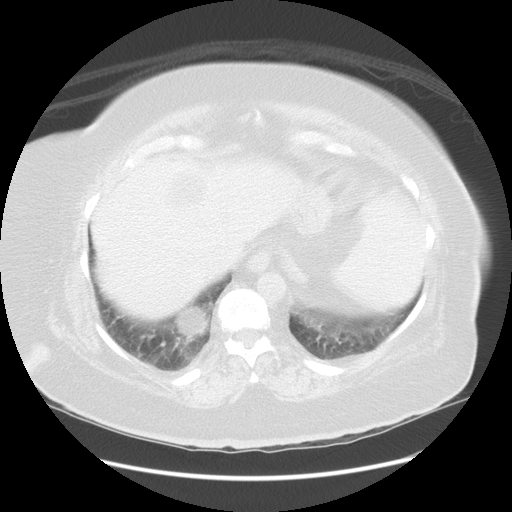
[im 17/55  lung]
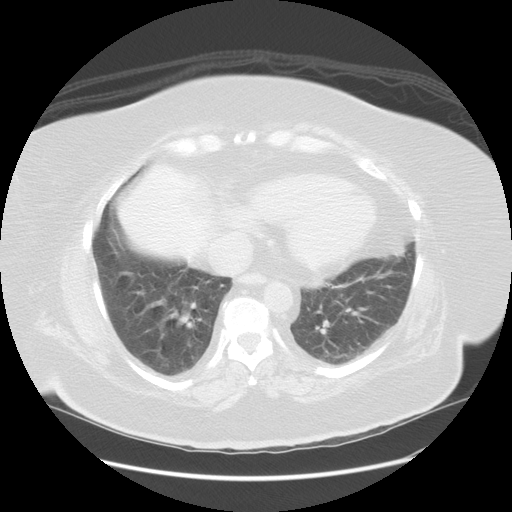
[im 21/55  mediastinal]
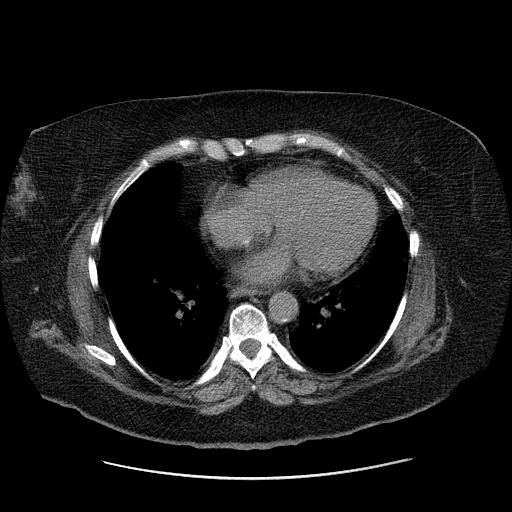
[im 21/55  lung]
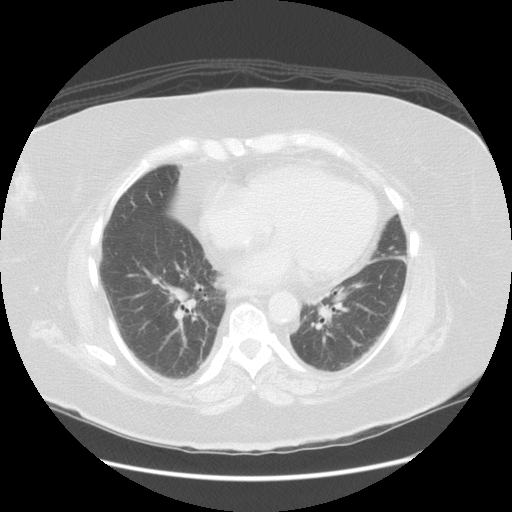
[im 25/55  lung]
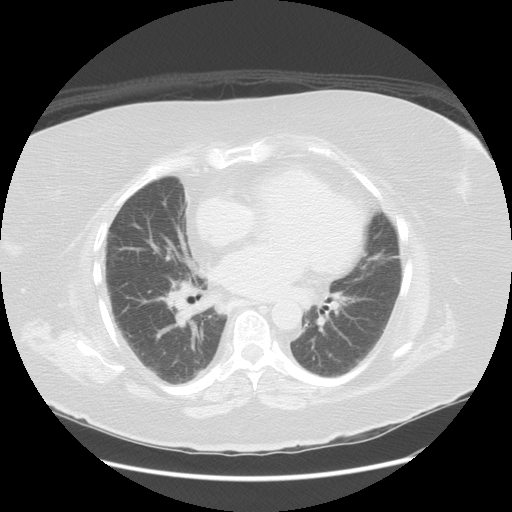
[im 30/55  lung]
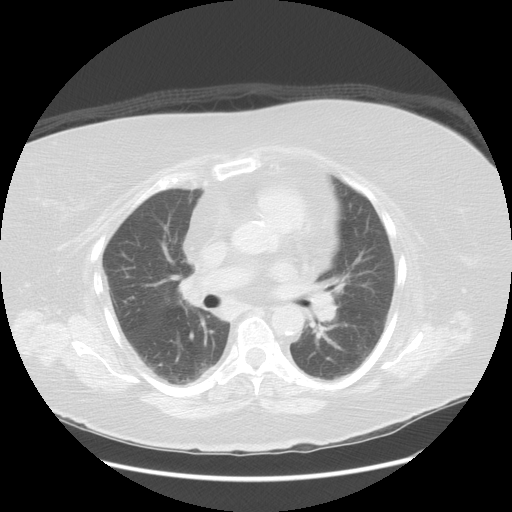
[im 34/55  lung]
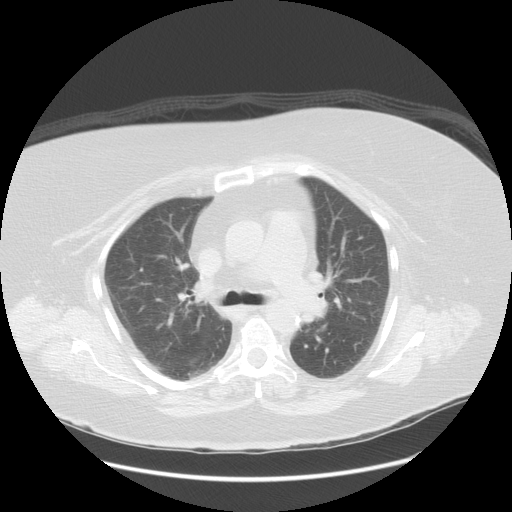
[im 38/55  mediastinal]
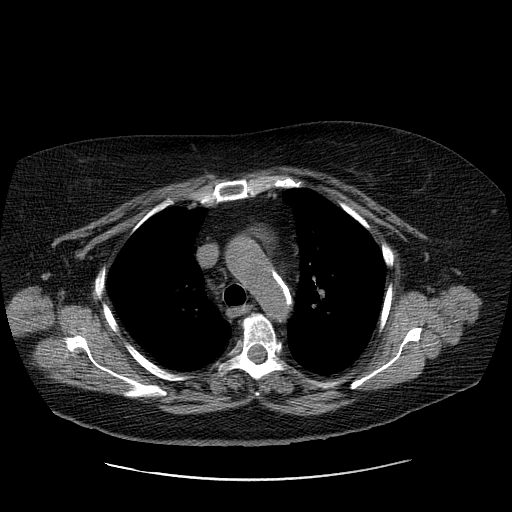
[im 38/55  lung]
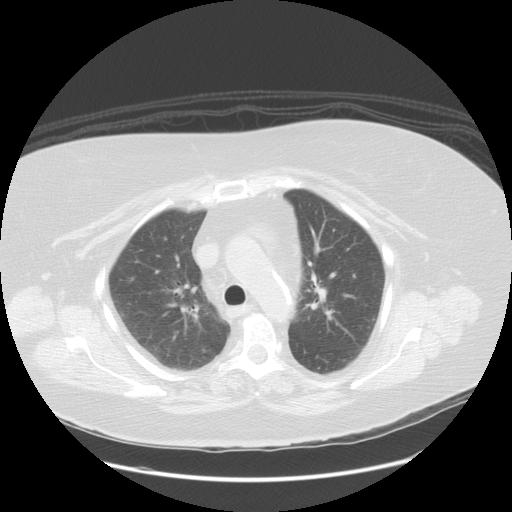
[im 42/55  lung]
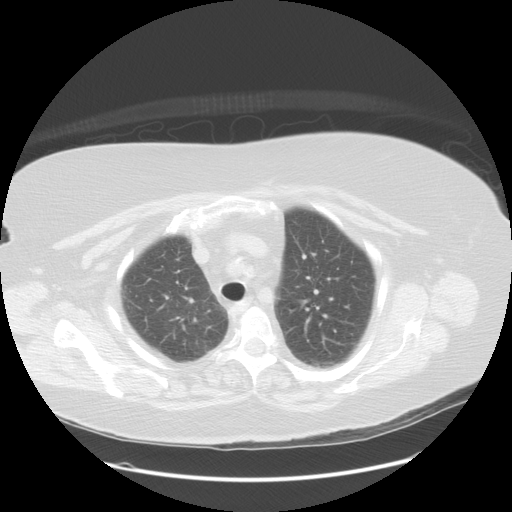
[im 46/55  lung]
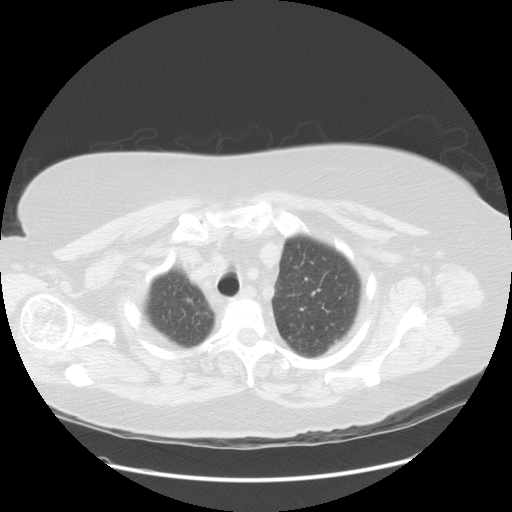
[im 50/55  lung]
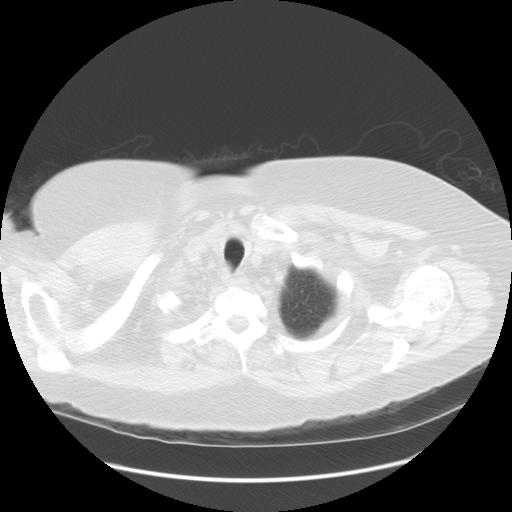

[Series 400: coronal · coronal · 0.78mm/px · 3 of 130 slices shown]
[im 26/130  lung]
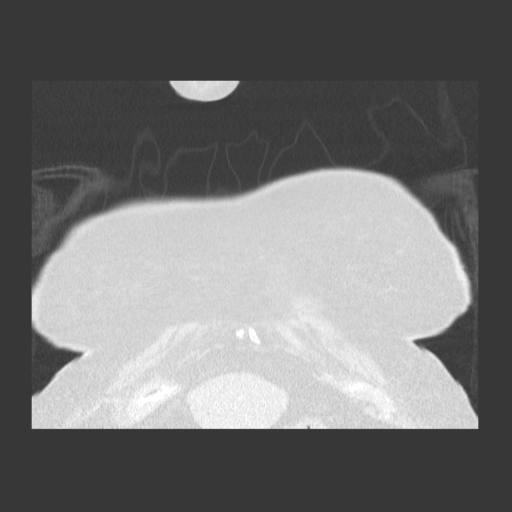
[im 52/130  lung]
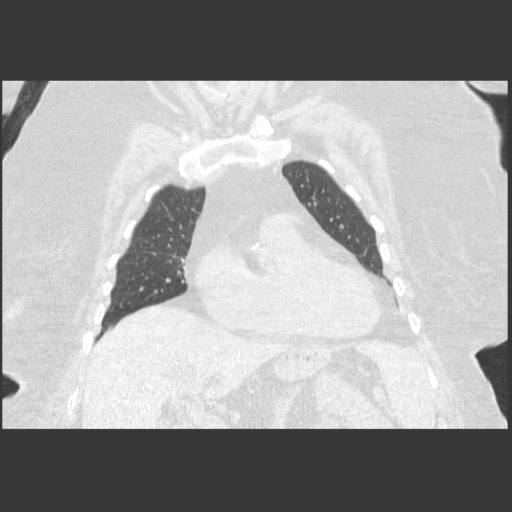
[im 78/130  lung]
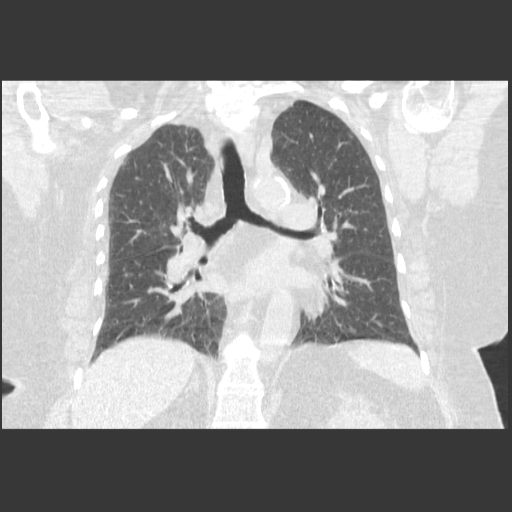

[15 of 36 positions shown; findings below may reference images not displayed]

FINDINGS: No axillary adenopathy.  No mediastinal or hilar
adenopathy.  Mediastinal lipomatosis is present.  Aortic arch
atherosclerosis.  No pericardial or pleural effusion.  Incidental
imaging the upper abdomen demonstrates a similar appearance of the
large left extrarenal pelvis.  Partially visualized right renal
atrophy.  Adrenal glands appear within normal limits.  The central
airways are patent.  There is no bronchial wall thickening or
mucoid impaction.  Linear scarring or atelectasis is present within
the lingula.  Levoconvex thoracic scoliosis.
IMPRESSION: Noncontrast chest CT is within normal limits.  Incidental abdominal
findings appears similar to the prior examination.

## 2014-06-01 ENCOUNTER — Telehealth (HOSPITAL_COMMUNITY): Payer: Self-pay

## 2014-06-01 NOTE — Telephone Encounter (Signed)
Encounter complete. 

## 2014-06-06 ENCOUNTER — Ambulatory Visit (HOSPITAL_COMMUNITY)
Admission: RE | Admit: 2014-06-06 | Discharge: 2014-06-06 | Disposition: A | Discharge status: Home / Self Care | Payer: Medicare Other | Source: Ambulatory Visit | Attending: Cardiovascular Disease | Admitting: Cardiovascular Disease

## 2014-06-06 DIAGNOSIS — E669 Obesity, unspecified: Secondary | ICD-10-CM | POA: Insufficient documentation

## 2014-06-06 DIAGNOSIS — R079 Chest pain, unspecified: Secondary | ICD-10-CM | POA: Insufficient documentation

## 2014-06-06 DIAGNOSIS — R5381 Other malaise: Secondary | ICD-10-CM | POA: Diagnosis not present

## 2014-06-06 DIAGNOSIS — Z8249 Family history of ischemic heart disease and other diseases of the circulatory system: Secondary | ICD-10-CM | POA: Insufficient documentation

## 2014-06-06 DIAGNOSIS — R0602 Shortness of breath: Secondary | ICD-10-CM | POA: Insufficient documentation

## 2014-06-06 DIAGNOSIS — Z87891 Personal history of nicotine dependence: Secondary | ICD-10-CM | POA: Insufficient documentation

## 2014-06-06 DIAGNOSIS — R42 Dizziness and giddiness: Secondary | ICD-10-CM | POA: Diagnosis present

## 2014-06-06 DIAGNOSIS — I10 Essential (primary) hypertension: Secondary | ICD-10-CM | POA: Insufficient documentation

## 2014-06-06 DIAGNOSIS — R5383 Other fatigue: Secondary | ICD-10-CM

## 2014-06-06 MED ORDER — TECHNETIUM TC 99M SESTAMIBI GENERIC - CARDIOLITE
30.9000 | Freq: Once | INTRAVENOUS | Status: AC | PRN
  Administered 2014-06-06: 30.9 via INTRAVENOUS

## 2014-06-06 MED ORDER — REGADENOSON 0.4 MG/5ML IV SOLN
0.4000 mg | Freq: Once | INTRAVENOUS | Status: AC
  Administered 2014-06-06: 0.4 mg via INTRAVENOUS

## 2014-06-06 MED ORDER — TECHNETIUM TC 99M SESTAMIBI GENERIC - CARDIOLITE
10.4000 | Freq: Once | INTRAVENOUS | Status: AC | PRN
  Administered 2014-06-06: 10 via INTRAVENOUS

## 2014-06-06 NOTE — Procedures (Addendum)
[facility] [address] [place] [address] [place] [address] [phone]  Cardiology Nuclear Med Study  [name] is a 78 y.o. female     MRN : [id number]     DOB: August 30, 1936  Procedure Date: 06/06/2014  Nuclear Med Background Indication for Stress Test:  Evaluation for Ischemia History:  No prior cardiac history, prior MPI 1/07 nonischemic Cardiac Risk Factors: Family History - CAD, History of Smoking, Hypertension and Obesity  Symptoms:  Chest Pain, Dizziness, DOE, Fatigue and SOB   Nuclear Pre-Procedure Caffeine/Decaff Intake:  7:00pm NPO After: 5:30am   IV Site: R Antecubital  IV 0.9% NS with Angio Cath:  22g  Chest Size (in):  n/a IV Started by: Koren Shiver, CNMT  Height:  (1.6 m)  Cup Size: 42D  BMI:  Body mass index is 35.97 kg/(m^2). Weight:  203 lb (92.08 kg)   Tech Comments:  n/a    Nuclear Med Study 1 or 2 day study: 1 day  Stress Test Type:  Lexiscan  Order Authorizing Provider:  Verdis Prime III, MD   Resting Radionuclide: Technetium 17m Sestamibi  Resting Radionuclide Dose: 10.4 mCi   Stress Radionuclide:  Technetium 79m Sestamibi  Stress Radionuclide Dose: 30.9 mCi           Stress Protocol Rest HR: 59 Stress HR: 64  Rest BP: 124/65 Stress BP: 124/69  Exercise Time (min): n/a METS: n/a   Predicted Max HR: 144 bpm % Max HR: 48.61 bpm Rate Pressure Product: 8890  Dose of Adenosine (mg):  n/a Dose of Lexiscan: 0.4 mg  Dose of Atropine (mg): n/a Dose of Dobutamine: n/a mcg/kg/min (at max HR)  Stress Test Technologist: Esperanza Sheets, CCT Nuclear Technologist: Gonzella Lex, CNMT   Rest Procedure:  Myocardial perfusion imaging was performed at rest 45 minutes following the intravenous administration of Technetium 13m Sestamibi. Stress Procedure:  The patient received IV Lexiscan 0.4 mg over 15-seconds.  Technetium 21m Sestamibi injected IV at 30-seconds.  There were no significant changes with Lexiscan.   Quantitative spect images were obtained after a 45 minute delay.  Transient Ischemic Dilatation (Normal <1.22):  1.08  QGS EDV:  44 ml QGS ESV:  10 ml LV Ejection Fraction: 78%       Rest ECG: NSR - Normal EKG  Stress ECG: No significant change from baseline ECG  QPS Raw Data Images:  Normal; no motion artifact; normal heart/lung ratio. Stress Images:  Normal homogeneous uptake in all areas of the myocardium. Rest Images:  Normal homogeneous uptake in all areas of the myocardium. Subtraction (SDS):  No evidence of ischemia.  Impression Exercise Capacity:  Lexiscan with no exercise. BP Response:  Normal blood pressure response. Clinical Symptoms:  No significant symptoms noted. ECG Impression:  No significant ST segment change suggestive of ischemia. Comparison with Prior Nuclear Study: No significant change from previous study  Overall Impression:  Normal stress nuclear study.  LV Wall Motion:  NL LV Function; NL Wall Motion   Runell Gess, MD  06/06/2014 12:23 PM

## 2014-07-17 ENCOUNTER — Ambulatory Visit: Payer: Medicare Other | Admitting: Interventional Cardiology

## 2014-07-30 IMAGING — CR DG ABDOMEN 1V
1 series · 1 of 1 positions shown · non-contrast
Comparison: CT abdomen pelvis 01/10/2013

CLINICAL DATA: Status post stent placement.

ABDOMEN - 1 VIEW

[view not recorded]
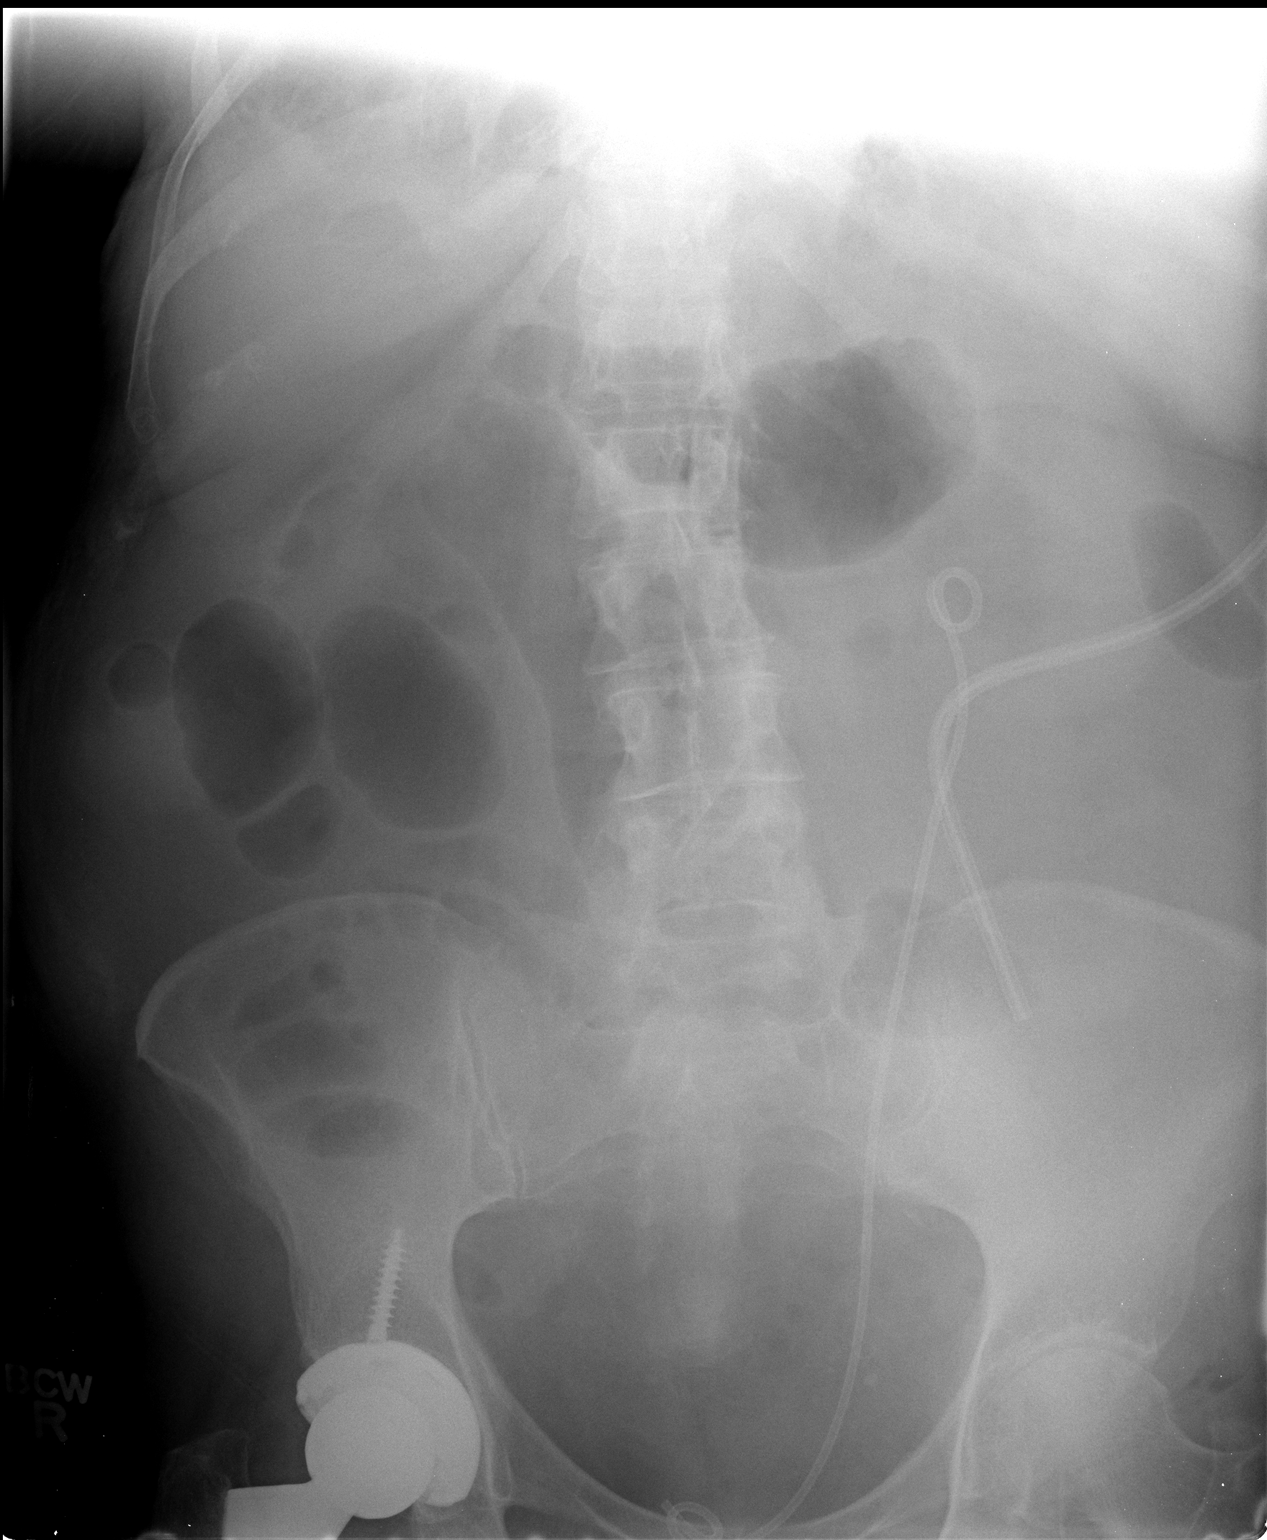

[1 of 1 positions shown; findings below may reference images not displayed]

FINDINGS: In the a left double-J ureteral stent is in place.  An
additional drainage tube projects over the left kidney.  The
patient is status post right total hip arthroplasty.  Bowel gas
pattern is unremarkable.
IMPRESSION: A double-J ureteral stent is in place.  Both loops are formed.

A second drainage catheter enters from the left lateral flank and
projects over the left kidney.  The tip is outside the expected
course of the ureter.

## 2014-08-08 ENCOUNTER — Encounter: Payer: Self-pay | Admitting: Interventional Cardiology

## 2014-11-09 ENCOUNTER — Other Ambulatory Visit: Payer: Self-pay | Admitting: Gastroenterology

## 2015-01-22 ENCOUNTER — Encounter (HOSPITAL_COMMUNITY): Payer: Self-pay

## 2015-01-22 ENCOUNTER — Ambulatory Visit (HOSPITAL_COMMUNITY): Admit: 2015-01-22 | Payer: Self-pay | Admitting: Gastroenterology

## 2015-01-22 SURGERY — COLONOSCOPY WITH PROPOFOL
Anesthesia: Monitor Anesthesia Care

## 2024-08-15 DEATH — deceased
